# Patient Record
Sex: Female | Born: 1985 | Race: White | Hispanic: No | Marital: Single | Smoking: Never smoker
Health system: Southern US, Community
[De-identification: ages and names within clinical notes are randomized; demographics above are authoritative.]

## PROBLEM LIST (undated history)

## (undated) DIAGNOSIS — E282 Polycystic ovarian syndrome: Secondary | ICD-10-CM

## (undated) DIAGNOSIS — Z8742 Personal history of other diseases of the female genital tract: Secondary | ICD-10-CM

## (undated) DIAGNOSIS — M419 Scoliosis, unspecified: Secondary | ICD-10-CM

## (undated) HISTORY — DX: Personal history of other diseases of the female genital tract: Z87.42

---

## 2017-10-06 ENCOUNTER — Encounter: Payer: Self-pay | Admitting: Emergency Medicine

## 2017-10-06 ENCOUNTER — Emergency Department: Payer: BLUE CROSS/BLUE SHIELD

## 2017-10-06 ENCOUNTER — Emergency Department
Admission: EM | Admit: 2017-10-06 | Discharge: 2017-10-06 | Disposition: A | Discharge status: Home / Self Care | Payer: BLUE CROSS/BLUE SHIELD | Attending: Emergency Medicine | Admitting: Emergency Medicine

## 2017-10-06 DIAGNOSIS — Y999 Unspecified external cause status: Secondary | ICD-10-CM | POA: Insufficient documentation

## 2017-10-06 DIAGNOSIS — Y939 Activity, unspecified: Secondary | ICD-10-CM | POA: Diagnosis not present

## 2017-10-06 DIAGNOSIS — Y929 Unspecified place or not applicable: Secondary | ICD-10-CM | POA: Diagnosis not present

## 2017-10-06 DIAGNOSIS — M25532 Pain in left wrist: Secondary | ICD-10-CM | POA: Diagnosis not present

## 2017-10-06 HISTORY — DX: Scoliosis, unspecified: M41.9

## 2017-10-06 HISTORY — DX: Polycystic ovarian syndrome: E28.2

## 2017-10-06 MED ORDER — MELOXICAM 15 MG PO TABS: 15.0000 mg | ORAL_TABLET | Freq: Every day | ORAL | 1 refills | Status: AC

## 2017-10-06 NOTE — ED Provider Notes (Signed)
[facility] Emergency Department Provider Note  ____________________________________________  Time seen: Approximately 9:18 PM  I have reviewed the triage vital signs and the nursing notes.   HISTORY  Chief Complaint Wrist Pain    HPI Nichole Pitts is a 32 y.o. female presents to the emergency department with 7 out of 10 aching and sore left wrist pain after patient fell on an outstretched left hand while rollerskating earlier tonight.  She denies weakness or changes in sensation of the upper extremities.  No skin compromise.  No alleviating measures have been attempted.   Past Medical History:  Diagnosis Date  . PCOS (polycystic ovarian syndrome)   . Scoliosis     There are no active problems to display for this patient.   History reviewed. No pertinent surgical history.  Prior to Admission medications   Medication Sig Start Date End Date Taking? Authorizing Provider  meloxicam (MOBIC) 15 MG tablet Take 1 tablet (15 mg total) by mouth daily for 7 days. 10/06/17 10/13/17  Orvil Feil, PA-C    Allergies Patient has no known allergies.  No family history on file.  Social History Social History   Tobacco Use  . Smoking status: Never Smoker  . Smokeless tobacco: Never Used  Substance Use Topics  . Alcohol use: Not on file  . Drug use: Not on file     Review of Systems  Constitutional: No fever/chills Eyes: No visual changes. No discharge ENT: No upper respiratory complaints. Cardiovascular: no chest pain. Respiratory: no cough. No SOB. Gastrointestinal: No abdominal pain.  No nausea, no vomiting.  No diarrhea.  No constipation. Musculoskeletal: Patient has left wrist pain.  Skin: Negative for rash, abrasions, lacerations, ecchymosis. Neurological: Negative for headaches, focal weakness or numbness.   ____________________________________________   PHYSICAL EXAM:  VITAL SIGNS: ED Triage Vitals  Enc Vitals Group     BP 10/06/17  1943 116/77     Pulse Rate 10/06/17 1943 96     Resp 10/06/17 1943 18     Temp 10/06/17 1943 98.4 F (36.9 C)     Temp Source 10/06/17 1943 Oral     SpO2 10/06/17 1943 100 %     Weight 10/06/17 1944 165 lb (74.8 kg)     Height 10/06/17 1944 5\' 2"  (1.575 m)     Head Circumference --      Peak Flow --      Pain Score 10/06/17 1944 4     Pain Loc --      Pain Edu? --      Excl. in GC? --      Constitutional: Alert and oriented. Well appearing and in no acute distress. Eyes: Conjunctivae are normal. PERRL. EOMI. Head: Atraumatic. Cardiovascular: Normal rate, regular rhythm. Normal S1 and S2.  Good peripheral circulation. Respiratory: Normal respiratory effort without tachypnea or retractions. Lungs CTAB. Good air entry to the bases with no decreased or absent breath sounds. Musculoskeletal: Patient is able to perform limited range of motion of the left wrist, likely secondary to pain.  She is able to move all 5 left fingers.  Skin overlying left wrist is warm with a palpable radial pulse.  Capillary refill is less than 2 seconds.  No pain was elicited with palpation of the anatomical snuffbox. Neurologic:  Normal speech and language. No gross focal neurologic deficits are appreciated.  Skin:  Skin is warm, dry and intact. No rash noted. Psychiatric: Mood and affect are normal. Speech and behavior are normal. Patient  exhibits appropriate insight and judgement.   ____________________________________________   LABS (all labs ordered are listed, but only abnormal results are displayed)  Labs Reviewed - No data to display ____________________________________________  EKG   ____________________________________________  RADIOLOGY I personally viewed and evaluated these images as part of my medical decision making, as well as reviewing the written report by the radiologist  Dg Wrist Complete Left  Result Date: 10/06/2017 CLINICAL DATA:  Fall during roller-skating today. Left wrist  injury and pain. Initial encounter. EXAM: LEFT WRIST - COMPLETE 3+ VIEW COMPARISON:  None. FINDINGS: There is no evidence of fracture or dislocation. There is no evidence of arthropathy or other focal bone abnormality. Soft tissues are unremarkable. IMPRESSION: Negative. Electronically Signed   By: Myles RosenthalJohn  Stahl M.D.   On: 10/06/2017 20:30    ____________________________________________    PROCEDURES  Procedure(s) performed:    Procedures    Medications - No data to display   ____________________________________________   INITIAL IMPRESSION / ASSESSMENT AND PLAN / ED COURSE  Pertinent labs & imaging results that were available during my care of the patient were reviewed by me and considered in my medical decision making (see chart for details).  Review of the [place] CSRS was performed in accordance of the [facility] prior to dispensing any controlled drugs.    Assessment and plan Left wrist pain Patient presents to the emergency department with left wrist pain after a fall while rollerskating.  Differential diagnosis included fracture versus sprain.  Initial x-ray examination of the left wrist revealed no acute fractures or bony abnormalities.  Patient was splinted in the emergency department and referred to orthopedics.  Meloxicam was prescribed for pain.  All patient questions were answered.     ____________________________________________  FINAL CLINICAL IMPRESSION(S) / ED DIAGNOSES  Final diagnoses:  Left wrist pain      NEW MEDICATIONS STARTED DURING THIS VISIT:  ED Discharge Orders        Ordered    meloxicam (MOBIC) 15 MG tablet  Daily     10/06/17 2040          This chart was dictated using voice recognition software/Dragon. Despite best efforts to proofread, errors can occur which can change the meaning. Any change was purely unintentional.    Gasper LloydWoods, Jaclyn M, PA-C 10/06/17 2121    Sharman CheekStafford, Phillip, MD 10/07/17 [phone]

## 2017-10-06 NOTE — ED Triage Notes (Signed)
Patient states that she was skating and fell. Patient with pain and swelling to left wrist.

## 2018-07-18 ENCOUNTER — Ambulatory Visit: Payer: BLUE CROSS/BLUE SHIELD | Admitting: Certified Nurse Midwife

## 2018-07-18 ENCOUNTER — Other Ambulatory Visit (HOSPITAL_COMMUNITY)
Admission: RE | Admit: 2018-07-18 | Discharge: 2018-07-18 | Disposition: A | Discharge status: Home / Self Care | Payer: BLUE CROSS/BLUE SHIELD | Source: Ambulatory Visit | Attending: Certified Nurse Midwife | Admitting: Certified Nurse Midwife

## 2018-07-18 ENCOUNTER — Encounter: Payer: Self-pay | Admitting: Certified Nurse Midwife

## 2018-07-18 ENCOUNTER — Other Ambulatory Visit: Payer: Self-pay

## 2018-07-18 VITALS — BP 113/78 | HR 99 | Ht 62.0 in | Wt 184.3 lb

## 2018-07-18 DIAGNOSIS — E282 Polycystic ovarian syndrome: Secondary | ICD-10-CM

## 2018-07-18 DIAGNOSIS — Z124 Encounter for screening for malignant neoplasm of cervix: Secondary | ICD-10-CM

## 2018-07-18 DIAGNOSIS — N939 Abnormal uterine and vaginal bleeding, unspecified: Secondary | ICD-10-CM

## 2018-07-18 DIAGNOSIS — Z01419 Encounter for gynecological examination (general) (routine) without abnormal findings: Secondary | ICD-10-CM

## 2018-07-18 DIAGNOSIS — Z6833 Body mass index (BMI) 33.0-33.9, adult: Secondary | ICD-10-CM

## 2018-07-18 NOTE — Patient Instructions (Addendum)
WE WOULD LOVE TO HEAR FROM YOU!!!!   Thank you Nichole Pitts for visiting [facility].  Providing our patients with the best experience possible is really important to [place], and we hope that you felt that on your recent visit. The most valuable feedback we get comes from [place]!!    If you receive a survey please take a couple of minutes to let us know how we did.Thank you for continuing to trust [place] with your care.   [facility]   Polycystic Ovarian Syndrome  Polycystic ovarian syndrome (PCOS) is a common hormonal disorder among women of reproductive age. In most women with PCOS, many small fluid-filled sacs (cysts) grow on the ovaries, and the cysts are not part of a normal menstrual cycle. PCOS can cause problems with your menstrual periods and make it difficult to get pregnant. It can also cause an increased risk of miscarriage with pregnancy. If it is not treated, PCOS can lead to serious health problems, such as diabetes and heart disease. What are the causes? The cause of PCOS is not known, but it may be the result of a combination of certain factors, such as:  Irregular menstrual cycle.  High levels of certain hormones (androgens).  Problems with the hormone that helps to control blood sugar (insulin resistance).  Certain genes. What increases the risk? This condition is more likely to develop in women who have a family history of PCOS. What are the signs or symptoms? Symptoms of PCOS may include:  Multiple ovarian cysts.  Infrequent periods or no periods.  Periods that are too frequent or too heavy.  Unpredictable periods.  Inability to get pregnant (infertility) because of not ovulating.  Increased growth of hair on the face, chest, stomach, back, thumbs, thighs, or toes.  Acne or oily skin. Acne may develop during adulthood, and it may not respond to treatment.  Pelvic pain.  Weight gain or obesity.  Patches of thickened and  dark brown or black skin on the neck, arms, breasts, or thighs (acanthosis nigricans).  Excess hair growth on the face, chest, abdomen, or upper thighs (hirsutism). How is this diagnosed? This condition is diagnosed based on:  Your medical history.  A physical exam, including a pelvic exam. Your health care provider may look for areas of increased hair growth on your skin.  Tests, such as: ? Ultrasound. This may be used to examine the ovaries and the lining of the uterus (endometrium) for cysts. ? Blood tests. These may be used to check levels of sugar (glucose), female hormone (testosterone), and female hormones (estrogen and progesterone) in your blood. How is this treated? There is no cure for PCOS, but treatment can help to manage symptoms and prevent more health problems from developing. Treatment varies depending on:  Your symptoms.  Whether you want to have a baby or whether you need birth control (contraception). Treatment may include nutrition and lifestyle changes along with:  Progesterone hormone to start a menstrual period.  Birth control pills to help you have regular menstrual periods.  Medicines to make you ovulate, if you want to get pregnant.  Medicine to reduce excessive hair growth.  Surgery, in severe cases. This may involve making small holes in one or both of your ovaries. This decreases the amount of testosterone that your body produces. Follow these instructions at home:  Take over-the-counter and prescription medicines only as told by your health care provider.  Follow a healthy meal plan. This can help you reduce  the effects of PCOS. ? Eat a healthy diet that includes lean proteins, complex carbohydrates, fresh fruits and vegetables, low-fat dairy products, and healthy fats. Make sure to eat enough fiber.  If you are overweight, lose weight as told by your health care provider. ? Losing 10% of your body weight may improve symptoms. ? Your health care  provider can determine how much weight loss is best for you and can help you lose weight safely.  Keep all follow-up visits as told by your health care provider. This is important. Contact a health care provider if:  Your symptoms do not get better with medicine.  You develop new symptoms. This information is not intended to replace advice given to you by your health care provider. Make sure you discuss any questions you have with your health care provider. Document Released: 08/11/2004 Document Revised: 12/14/2015 Document Reviewed: 10/03/2015 Elsevier Interactive Patient Education  2019 [place].   Diet for Polycystic Ovary Syndrome Polycystic ovary syndrome (PCOS) is a disorder of the chemicals (hormones) that regulate a woman's reproductive system, including monthly periods (menstruation). The condition causes important hormones to be out of balance. PCOS can:  Stop your periods or make them irregular.  Cause cysts to develop on your ovaries.  Make it difficult to get pregnant.  Stop your body from responding to the effects of insulin (insulin resistance). Insulin resistance can lead to obesity and diabetes. Changing what you eat can help you manage PCOS and improve your health. Following a balanced diet can help you lose weight and improve the way that your body uses insulin. What are tips for following this plan?  Follow a balanced diet for meals and snacks. Eat breakfast, lunch, dinner, and one or two snacks every day.  Include protein in each meal and snack.  Choose whole grains instead of products that are made with refined flour.  Eat a variety of foods.  Exercise regularly as told by your health care provider. Aim to do 30 or more minutes of exercise on most days of the week.  If you are overweight or obese: ? Pay attention to how many calories you eat. Cutting down on calories can help you lose weight. ? Work with your health care provider or a diet and  nutrition specialist (dietitian) to figure out how many calories you need each day. What foods can I eat?  Fruits Include a variety of colors and types. All fruits are helpful for PCOS. Vegetables Include a variety of colors and types. All vegetables are helpful for PCOS. Grains Whole grains, such as whole wheat. Whole-grain breads, crackers, cereals, and pasta. Unsweetened oatmeal, bulgur, barley, quinoa, and brown rice. Tortillas made from corn or whole-wheat flour. Meats and other proteins Low-fat (lean) proteins, such as fish, chicken, beans, eggs, and tofu. Dairy Low-fat dairy products, such as skim milk, cheese sticks, and yogurt. Beverages Low-fat or fat-free drinks, such as water, low-fat milk, sugar-free drinks, and small amounts of 100% fruit juice. Seasonings and condiments Ketchup. Mustard. Barbecue sauce. Relish. Low-fat or fat-free mayonnaise. Fats and oils Olive oil or canola oil. Walnuts and almonds. The items listed above may not be a complete list of recommended foods and beverages. Contact a dietitian for more options. What foods are not recommended? Foods that are high in calories or fat. Fried foods. Sweets. Products that are made from refined white flour, including white bread, pastries, white rice, and pasta. The items listed above may not be a complete list of foods and  beverages to avoid. Contact a dietitian for more information. Summary  PCOS is a hormonal imbalance that affects a woman's reproductive system.  You can help to manage your PCOS by exercising regularly and eating a healthy, varied diet of vegetables, fruit, whole grains, low-fat (lean) protein, and low-fat dairy products.  Changing what you eat can improve the way that your body uses insulin, help your hormones reach normal levels, and help you lose weight. This information is not intended to replace advice given to you by your health care provider. Make sure you discuss any questions you have  with your health care provider. Document Released: 08/09/2015 Document Revised: 02/19/2017 Document Reviewed: 02/19/2017 Elsevier Interactive Patient Education  2019 [place] 18-39 Years, Female Preventive care refers to lifestyle choices and visits with your health care provider that can promote health and wellness. What does preventive care include?   A yearly physical exam. This is also called an annual well check.  Dental exams once or twice a year.  Routine eye exams. Ask your health care provider how often you should have your eyes checked.  Personal lifestyle choices, including: ? Daily care of your teeth and gums. ? Regular physical activity. ? Eating a healthy diet. ? Avoiding tobacco and drug use. ? Limiting alcohol use. ? Practicing safe sex. ? Taking vitamin and mineral supplements as recommended by your health care provider. What happens during an annual well check? The services and screenings done by your health care provider during your annual well check will depend on your age, overall health, lifestyle risk factors, and family history of disease. Counseling Your health care provider may ask you questions about your:  Alcohol use.  Tobacco use.  Drug use.  Emotional well-being.  Home and relationship well-being.  Sexual activity.  Eating habits.  Work and work Statistician.  Method of birth control.  Menstrual cycle.  Pregnancy history. Screening You may have the following tests or measurements:  Height, weight, and BMI.  Diabetes screening. This is done by checking your blood sugar (glucose) after you have not eaten for a while (fasting).  Blood pressure.  Lipid and cholesterol levels. These may be checked every 5 years starting at age 63.  Skin check.  Hepatitis C blood test.  Hepatitis B blood test.  Sexually transmitted disease (STD) testing.  BRCA-related cancer screening. This may be done if you have a  family history of breast, ovarian, tubal, or peritoneal cancers.  Pelvic exam and Pap test. This may be done every 3 years starting at age 43. Starting at age 74, this may be done every 5 years if you have a Pap test in combination with an HPV test. Discuss your test results, treatment options, and if necessary, the need for more tests with your health care provider. Vaccines Your health care provider may recommend certain vaccines, such as:  Influenza vaccine. This is recommended every year.  Tetanus, diphtheria, and acellular pertussis (Tdap, Td) vaccine. You may need a Td booster every 10 years.  Varicella vaccine. You may need this if you have not been vaccinated.  HPV vaccine. If you are 52 or younger, you may need three doses over 6 months.  Measles, mumps, and rubella (MMR) vaccine. You may need at least one dose of MMR. You may also need a second dose.  Pneumococcal 13-valent conjugate (PCV13) vaccine. You may need this if you have certain conditions and were not previously vaccinated.  Pneumococcal polysaccharide (PPSV23) vaccine. You may  need one or two doses if you smoke cigarettes or if you have certain conditions.  Meningococcal vaccine. One dose is recommended if you are age 69-21 years and a first-year college student living in a residence hall, or if you have one of several medical conditions. You may also need additional booster doses.  Hepatitis A vaccine. You may need this if you have certain conditions or if you travel or work in places where you may be exposed to hepatitis A.  Hepatitis B vaccine. You may need this if you have certain conditions or if you travel or work in places where you may be exposed to hepatitis B.  Haemophilus influenzae type b (Hib) vaccine. You may need this if you have certain risk factors. Talk to your health care provider about which screenings and vaccines you need and how often you need them. This information is not intended to replace  advice given to you by your health care provider. Make sure you discuss any questions you have with your health care provider. Document Released: 06/13/2001 Document Revised: 11/28/2016 Document Reviewed: 02/16/2015 Elsevier Interactive Patient Education  2019 [employer].

## 2018-07-18 NOTE — Progress Notes (Addendum)
ANNUAL PREVENTATIVE CARE GYN  ENCOUNTER NOTE  Subjective:       Nichole Pitts is a 33 y.o. G0P0000 female here for a routine annual gynecologic exam.  Current complaints: 1. Irregular menses  History of PCOS. Not currently taking medications, symptoms are management with dietary and lifestyle modifications. Labs last collected in 2013 or 2014. Never had ultrasound.   Reports intermittent irregular bleeding for the last three (3) to four (4), varying from heavy vaginal bleeding to light spotting to light dark vaginal discharge.   Denies difficulty breathing or respiratory distress, chest pain, abdominal pain, excessive vaginal bleeding, dysuria, and leg pain or swelling.    Gynecologic History  Patient's last menstrual period was 06/23/2018 (exact date). Period Duration (Days): 3 Period Pattern: (!) Irregular Menstrual Flow: Moderate Menstrual Control: Panty liner, Thin pad, Maxi pad, Tampon Dysmenorrhea: (!) Moderate Dysmenorrhea Symptoms: Cramping  Contraception: abstinence  Last Pap: 2017. Results were: normal per patient  Obstetric History  OB History  Gravida Para Term Preterm AB Living  0 0 0 0 0 0  SAB TAB Ectopic Multiple Live Births  0 0 0 0 0    Past Medical History:  Diagnosis Date  . History of PCOS   . PCOS (polycystic ovarian syndrome)   . Scoliosis     No past surgical history on file.  No Known Allergies  Social History   Socioeconomic History  . Marital status: Married    Spouse name: Not on file  . Number of children: Not on file  . Years of education: Not on file  . Highest education level: Not on file  Occupational History  . Not on file  Social Needs  . Financial resource strain: Not on file  . Food insecurity:    Worry: Not on file    Inability: Not on file  . Transportation needs:    Medical: Not on file    Non-medical: Not on file  Tobacco Use  . Smoking status: Never Smoker  . Smokeless tobacco: Never Used  Substance and  Sexual Activity  . Alcohol use: Yes  . Drug use: Never  . Sexual activity: Not Currently  Lifestyle  . Physical activity:    Days per week: Not on file    Minutes per session: Not on file  . Stress: Not on file  Relationships  . Social connections:    Talks on phone: Not on file    Gets together: Not on file    Attends religious service: Not on file    Active member of club or organization: Not on file    Attends meetings of clubs or organizations: Not on file    Relationship status: Not on file  . Intimate partner violence:    Fear of current or ex partner: Not on file    Emotionally abused: Not on file    Physically abused: Not on file    Forced sexual activity: Not on file  Other Topics Concern  . Not on file  Social History Narrative  . Not on file    Family History  Problem Relation Age of Onset  . Cancer Father   . Diabetes Maternal Grandmother   . Stroke Maternal Grandmother   . Diabetes Paternal Grandmother     The following portions of the patient's history were reviewed and updated as appropriate: allergies, current medications, past family history, past medical history, past social history, past surgical history and problem list.  Review of Systems  ROS negative except  as noted above. Information obtained from patient.    Objective:   BP 113/78   Pulse 99   Ht 5\' 2"  (1.575 m)   Wt 184 lb 5 oz (83.6 kg)   LMP 06/23/2018 (Exact Date)   BMI 33.71 kg/m    CONSTITUTIONAL: Well-developed, well-nourished female in no acute distress.   PSYCHIATRIC: Normal mood and affect. Normal behavior. Normal judgment and thought content.  NEUROLGIC: Alert and oriented to person, place, and time. Normal muscle tone coordination. No cranial nerve deficit noted.  HENT:  Normocephalic, atraumatic, External right and left ear normal.   EYES: Conjunctivae and EOM are normal. Pupils are equal and round.   NECK: Normal range of motion, supple, no masses.  Normal thyroid.    SKIN: Skin is warm and dry. No rash noted. Not diaphoretic. No erythema. No pallor.  CARDIOVASCULAR: Normal heart rate noted, regular rhythm, no murmur.  RESPIRATORY: Clear to auscultation bilaterally. Effort and breath sounds normal, no problems with respiration noted.  BREASTS: Symmetric in size. No masses, skin changes, nipple drainage, or lymphadenopathy.  ABDOMEN: Soft, normal bowel sounds, no distention noted.  No tenderness, rebound or guarding.   PELVIC:  External Genitalia: Normal  BUS: Normal  Vagina: Normal  Cervix: Normal  Uterus: Normal  Adnexa: Normal  MUSCULOSKELETAL: Normal range of motion. No tenderness.  No cyanosis, clubbing, or edema.  2+ distal pulses.  LYMPHATIC: No Axillary, Supraclavicular, or Inguinal Adenopathy.  Assessment:   Annual gynecologic examination 33 y.o.   Contraception: abstinence   Obesity 1   Problem List Items Addressed This Visit      Other   BMI 33.0-33.9,adult   Relevant Orders   Thyroid Panel With TSH   Hemoglobin A1c   Lipid panel   Comprehensive metabolic panel    Other Visit Diagnoses    Well woman exam    -  Primary   Relevant Orders   CBC   Thyroid Panel With TSH   FSH/LH   Hemoglobin A1c   Lipid panel   Testosterone, Free, Total, SHBG   Comprehensive metabolic panel   DHEA-sulfate   Prolactin   Cytology - PAP   PCOS (polycystic ovarian syndrome)       Relevant Orders   Thyroid Panel With TSH   FSH/LH   Hemoglobin A1c   Lipid panel   Testosterone, Free, Total, SHBG   DHEA-sulfate   Prolactin   Abnormal uterine bleeding       Relevant Orders   CBC   Thyroid Panel With TSH   FSH/LH   Comprehensive metabolic panel   DHEA-sulfate   Prolactin   Screening for cervical cancer          Plan:   Pap: Pap Co Test  Labs: See orders, will contact patient with results.   Routine preventative health maintenance measures emphasized: Exercise/Diet/Weight control, Tobacco Warnings, Alcohol/Substance  use risks and Stress Management; see AVS  Discussed medication options for PCOS management, handouts given  Reviewed red flag symptoms and when to call  RTC x 1 year for ANNUAL EXAM or sooner ifneeded.    Gunnar Bulla, CNM [facility], [facility] 07/18/18 1:18 PM

## 2018-07-19 LAB — COMPREHENSIVE METABOLIC PANEL
ALBUMIN: 4.3 g/dL (ref 3.9–5.0)
ALT: 15 IU/L (ref 0–32)
AST: 17 IU/L (ref 0–40)
Albumin/Globulin Ratio: 1.9 (ref 1.2–2.2)
Alkaline Phosphatase: 74 IU/L (ref 39–117)
BILIRUBIN TOTAL: 0.3 mg/dL (ref 0.0–1.2)
BUN / CREAT RATIO: 14 (ref 9–23)
BUN: 9 mg/dL (ref 6–20)
CALCIUM: 9.3 mg/dL (ref 8.7–10.2)
CO2: 22 mmol/L (ref 20–29)
Chloride: 105 mmol/L (ref 96–106)
Creatinine, Ser: 0.66 mg/dL (ref 0.57–1.00)
GFR, EST AFRICAN AMERICAN: 137 mL/min/{1.73_m2} (ref >59)
GFR, EST NON AFRICAN AMERICAN: 119 mL/min/{1.73_m2} (ref >59)
GLUCOSE: 93 mg/dL (ref 65–99)
Globulin, Total: 2.3 g/dL (ref 1.5–4.5)
Potassium: 4.4 mmol/L (ref 3.5–5.2)
Sodium: 139 mmol/L (ref 134–144)
TOTAL PROTEIN: 6.6 g/dL (ref 6.0–8.5)

## 2018-07-19 LAB — CBC
HEMATOCRIT: 36.1 % (ref 34.0–46.6)
Hemoglobin: 12.3 g/dL (ref 11.1–15.9)
MCH: 33.1 pg — ABNORMAL HIGH (ref 26.6–33.0)
MCHC: 34.1 g/dL (ref 31.5–35.7)
MCV: 97 fL (ref 79–97)
PLATELETS: 243 10*3/uL (ref 150–450)
RBC: 3.72 x10E6/uL — ABNORMAL LOW (ref 3.77–5.28)
RDW: 11.2 % — AB (ref 11.7–15.4)
WBC: 6.4 10*3/uL (ref 3.4–10.8)

## 2018-07-19 LAB — PROLACTIN: Prolactin: 14.4 ng/mL (ref 4.8–23.3)

## 2018-07-19 LAB — LIPID PANEL
CHOL/HDL RATIO: 3 ratio (ref 0.0–4.4)
CHOLESTEROL TOTAL: 160 mg/dL (ref 100–199)
HDL: 54 mg/dL (ref >39)
LDL CALC: 93 mg/dL (ref 0–99)
TRIGLYCERIDES: 64 mg/dL (ref 0–149)
VLDL Cholesterol Cal: 13 mg/dL (ref 5–40)

## 2018-07-19 LAB — THYROID PANEL WITH TSH
Free Thyroxine Index: 1.8 (ref 1.2–4.9)
T3 Uptake Ratio: 23 % — ABNORMAL LOW (ref 24–39)
T4, Total: 8 ug/dL (ref 4.5–12.0)
TSH: 1.86 u[IU]/mL (ref 0.450–4.500)

## 2018-07-19 LAB — TESTOSTERONE, FREE, TOTAL, SHBG
SEX HORMONE BINDING: 70.6 nmol/L (ref 24.6–122.0)
Testosterone, Free: 2 pg/mL (ref 0.0–4.2)
Testosterone: 37 ng/dL (ref 8–48)

## 2018-07-19 LAB — FSH/LH
FSH: 2.6 m[IU]/mL
LH: 5.9 m[IU]/mL

## 2018-07-19 LAB — DHEA-SULFATE: DHEA-SO4: 170.2 ug/dL (ref 84.8–378.0)

## 2018-07-19 LAB — HEMOGLOBIN A1C
Est. average glucose Bld gHb Est-mCnc: 91 mg/dL
Hgb A1c MFr Bld: 4.8 % (ref 4.8–5.6)

## 2018-07-20 ENCOUNTER — Encounter: Payer: Self-pay | Admitting: Certified Nurse Midwife

## 2018-07-22 LAB — CYTOLOGY - PAP
DIAGNOSIS: NEGATIVE
HPV (WINDOPATH): NOT DETECTED

## 2018-07-23 ENCOUNTER — Encounter: Payer: Self-pay | Admitting: Certified Nurse Midwife

## 2018-07-28 ENCOUNTER — Encounter: Payer: Self-pay | Admitting: Certified Nurse Midwife

## 2018-09-13 ENCOUNTER — Encounter: Payer: Self-pay | Admitting: Certified Nurse Midwife

## 2018-10-01 ENCOUNTER — Encounter: Payer: Self-pay | Admitting: Certified Nurse Midwife

## 2020-01-01 IMAGING — DX DG WRIST COMPLETE 3+V*L*
4 series · 4 of 4 positions shown · non-contrast
Comparison: None.

CLINICAL DATA: Fall during roller-skating today. Left wrist injury
and pain. Initial encounter.

EXAM:
LEFT WRIST - COMPLETE 3+ VIEW

[wrist ap (1 of 2)]
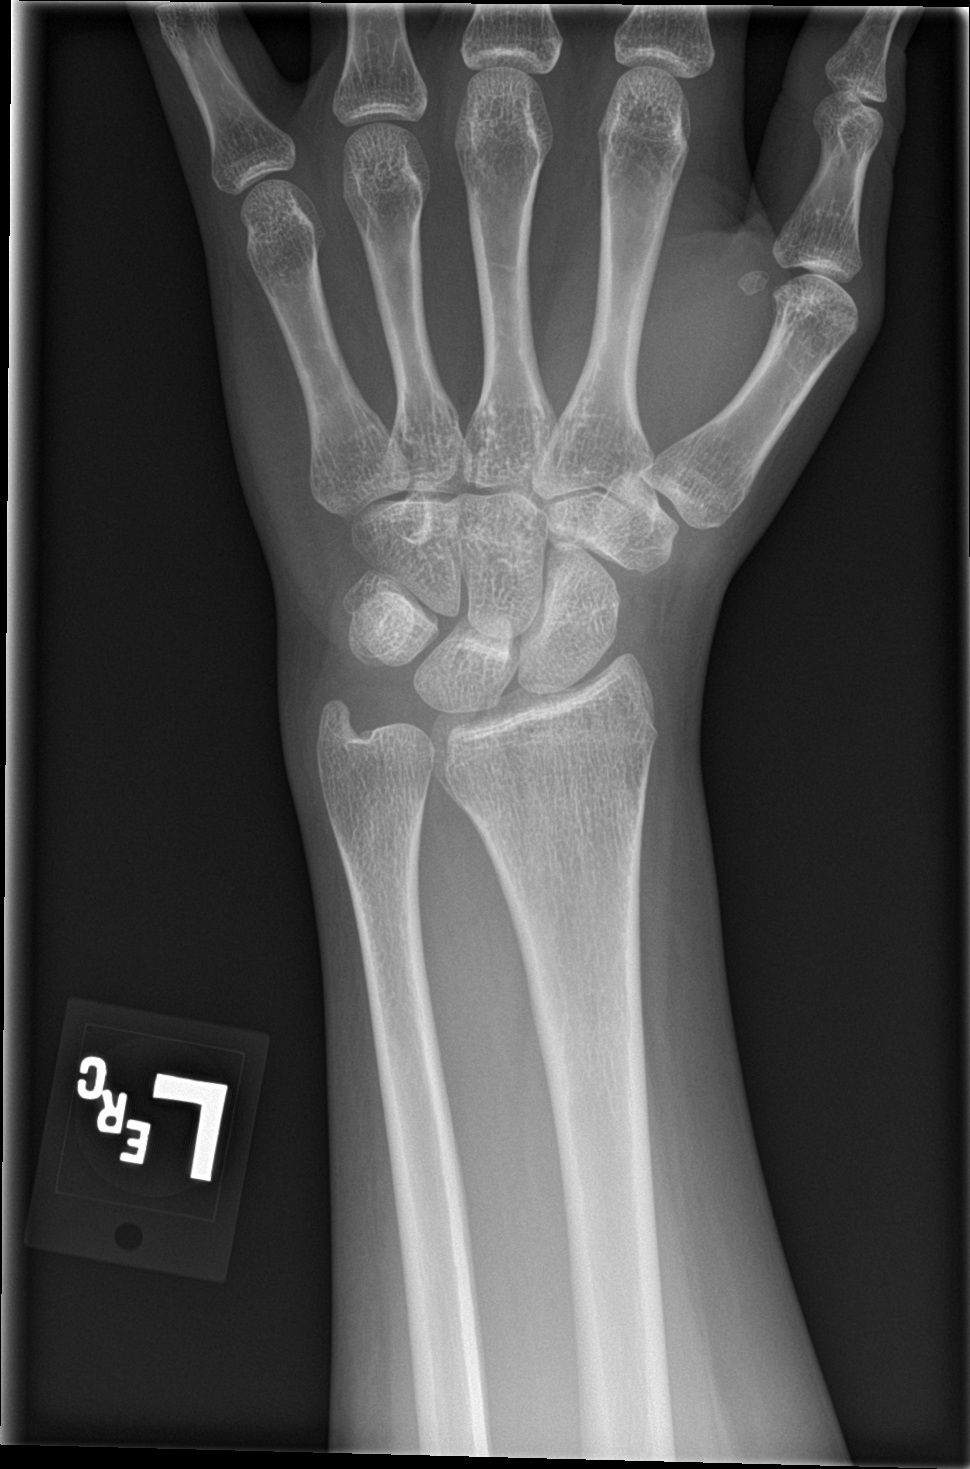

[wrist obl]
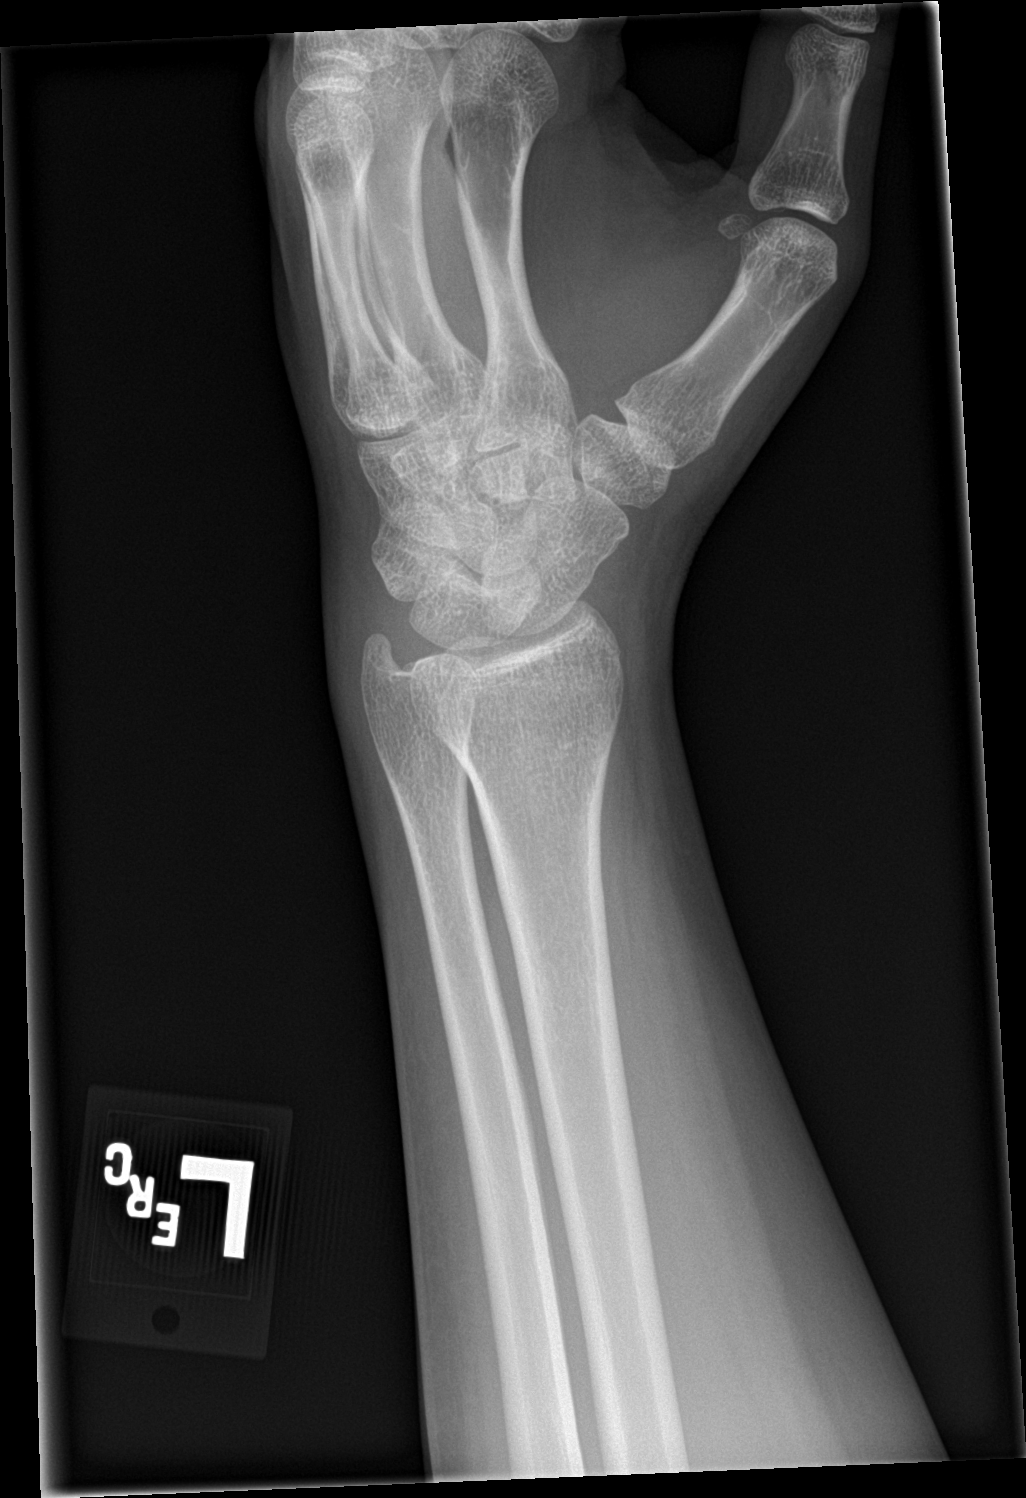

[wrist lat]
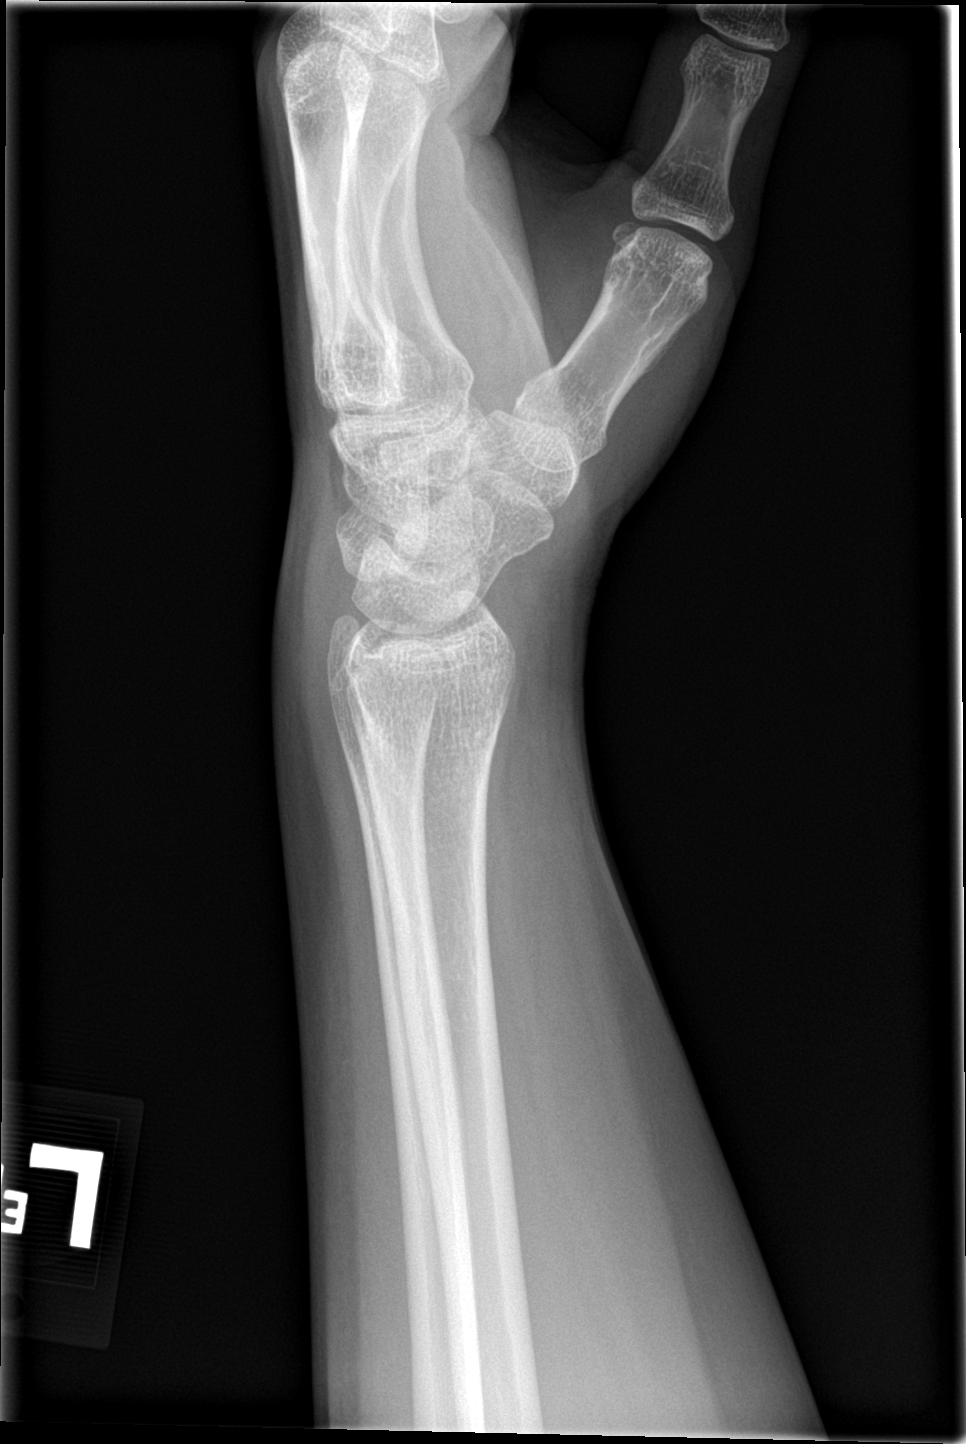

[wrist ap (2 of 2)]
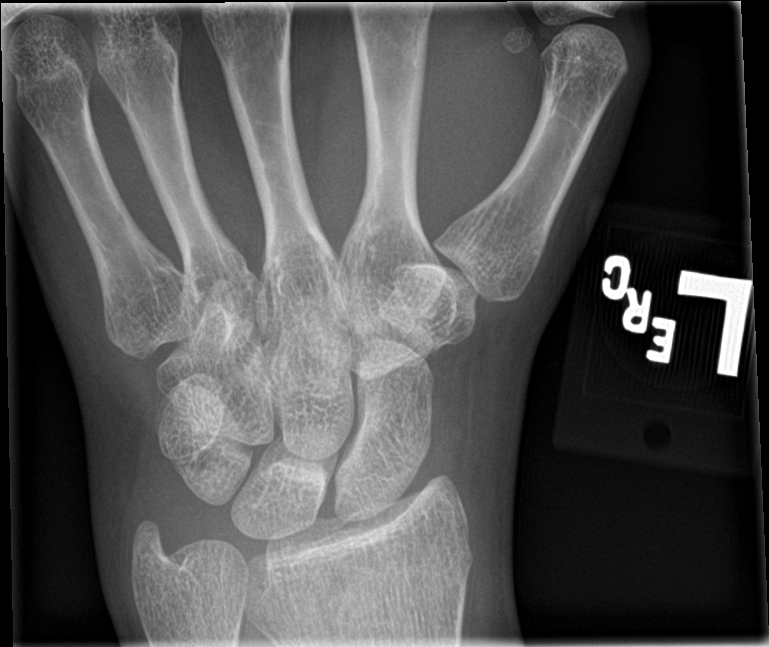

[4 of 4 positions shown; findings below may reference images not displayed]

FINDINGS: There is no evidence of fracture or dislocation. There is no
evidence of arthropathy or other focal bone abnormality. Soft
tissues are unremarkable.
IMPRESSION: Negative.

## 2020-04-08 ENCOUNTER — Ambulatory Visit (INDEPENDENT_AMBULATORY_CARE_PROVIDER_SITE_OTHER): Payer: BLUE CROSS/BLUE SHIELD | Admitting: Hospice and Palliative Medicine

## 2020-04-08 ENCOUNTER — Encounter: Payer: Self-pay | Admitting: Hospice and Palliative Medicine

## 2020-04-08 ENCOUNTER — Other Ambulatory Visit: Payer: Self-pay

## 2020-04-08 VITALS — BP 122/76 | HR 96 | Temp 97.9°F | Resp 16 | Ht 62.0 in | Wt 175.0 lb

## 2020-04-08 DIAGNOSIS — E282 Polycystic ovarian syndrome: Secondary | ICD-10-CM

## 2020-04-08 DIAGNOSIS — Z7689 Persons encountering health services in other specified circumstances: Secondary | ICD-10-CM | POA: Diagnosis not present

## 2020-04-08 NOTE — Progress Notes (Signed)
[facility] [address] [place], [place] [address]  Internal MEDICINE  Office Visit Note  Patient Name: Nichole Pitts  [id number]  [id number]  Date of Service: 04/12/2020   Complaints/HPI Nichole Pitts is here for establishment of PCP. Chief Complaint  Patient presents with  . New Patient (Initial Visit)  . Gastroesophageal Reflux  . 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   No facility-administered encounter medications on file as of 04/08/2020.    Surgical History: History reviewed. No pertinent surgical history.  Medical History: Past Medical History:  Diagnosis Date  . History of PCOS   . PCOS (polycystic ovarian syndrome)   . Scoliosis     Family History: Family History  Problem Relation Age of Onset  . Cancer Father   . Diabetes Maternal Grandmother   . Stroke Maternal Grandmother   . Diabetes Paternal Grandmother   . Hypertension Paternal Grandmother   . Hypertension Mother   . Hypertension Brother     Social History   Socioeconomic History  . Marital status: Single    Spouse name: Not on file  . Number of children: Not on file  . Years of education: Not on file  . Highest education level: Not on file  Occupational History  . Not on file  Tobacco  Use  . Smoking status: Never Smoker  . Smokeless tobacco: Never Used  Substance and Sexual Activity  . Alcohol use: Yes    Comment: occ  . Drug use: Never  . Sexual activity: Not Currently  Other Topics Concern  . Not on file  Social History Narrative  . Not on file   Social Determinants of Health   Financial Resource Strain: Not on file  Food Insecurity: Not on file  Transportation Needs: Not on file  Physical Activity: Not on file  Stress: Not on file  Social Connections: Not on file  Intimate Partner Violence: Not on file     Review of Systems  Constitutional: Negative for chills, diaphoresis and fatigue.  HENT: Negative for ear pain, postnasal drip and sinus pressure.   Eyes: Negative for photophobia, discharge, redness, itching and visual disturbance.  Respiratory: Negative for cough, shortness of breath and wheezing.   Cardiovascular: Negative for chest pain, palpitations and leg swelling.  Gastrointestinal: Negative for abdominal pain, constipation, diarrhea, nausea and vomiting.  Genitourinary: Negative for dysuria and flank pain.  Musculoskeletal: Negative for arthralgias, back pain, gait problem and neck pain.  Skin: Negative for color change.  Allergic/Immunologic: Negative for environmental allergies and food allergies.  Neurological: Negative for dizziness and headaches.  Hematological: Does not bruise/bleed easily.  Psychiatric/Behavioral: Negative for agitation, behavioral problems (depression) and hallucinations.    Vital Signs: BP 122/76   Pulse 96   Temp 97.9 F (36.6 C)   Resp 16   Ht 5\' 2"  (1.575 m)  Wt 175 lb (79.4 kg)   SpO2 98%   BMI 32.01 kg/m    Physical Exam Vitals reviewed.  Constitutional:      Appearance: Normal appearance. Nichole Pitts is normal weight.  Cardiovascular:     Rate and Rhythm: Normal rate and regular rhythm.     Pulses: Normal pulses.     Heart sounds: Normal heart sounds.  Pulmonary:     Effort: Pulmonary effort is  normal.     Breath sounds: Normal breath sounds.  Abdominal:     General: Abdomen is flat.  Musculoskeletal:        General: Normal range of motion.     Cervical back: Normal range of motion.  Skin:    General: Skin is warm.  Neurological:     General: No focal deficit present.     Mental Status: Nichole Pitts is alert and oriented to person, place, and time. Mental status is at baseline.  Psychiatric:        Mood and Affect: Mood normal.        Behavior: Behavior normal.        Thought Content: Thought content normal.        Judgment: Judgment normal.    Assessment/Plan: 1. Encounter to establish care with new doctor Will review baseline labs and adjust plan of care as indicated - CBC w/Diff/Platelet - Comprehensive Metabolic Panel (CMET) - Lipid Panel With LDL/HDL Ratio - TSH + free T4  2. PCOS (polycystic ovarian syndrome) Symptoms remain well controlled without medication therapy at this time, continue monitoring  General Counseling: shimeka bacot understanding of the findings of todays visit and agrees with plan of treatment. I have discussed any further diagnostic evaluation that may be needed or ordered today. We also reviewed Nichole Pitts medications today. Nichole Pitts has been encouraged to call the office with any questions or concerns that should arise related to todays visit.  Orders Placed This Encounter  Procedures  . CBC w/Diff/Platelet  . Comprehensive Metabolic Panel (CMET)  . Lipid Panel With LDL/HDL Ratio  . TSH + free T4      Time spent:30  Minutes Time spent includes review of chart, medications, test results and follow-up plan with the patient.  This patient was seen by Leeanne Deed AGNP-C in Collaboration with Dr Lyndon Code as a part of collaborative care agreement  Leeanne Deed [facility] Internal Medicine

## 2020-04-10 LAB — COMPREHENSIVE METABOLIC PANEL
ALT: 19 IU/L (ref 0–32)
AST: 22 IU/L (ref 0–40)
Albumin/Globulin Ratio: 1.6 (ref 1.2–2.2)
Albumin: 4.1 g/dL (ref 3.8–4.8)
Alkaline Phosphatase: 82 IU/L (ref 44–121)
BUN/Creatinine Ratio: 16 (ref 9–23)
BUN: 10 mg/dL (ref 6–20)
Bilirubin Total: 0.3 mg/dL (ref 0.0–1.2)
CO2: 21 mmol/L (ref 20–29)
Calcium: 9.3 mg/dL (ref 8.7–10.2)
Chloride: 105 mmol/L (ref 96–106)
Creatinine, Ser: 0.63 mg/dL (ref 0.57–1.00)
GFR calc Af Amer: 137 mL/min/{1.73_m2} (ref >59)
GFR calc non Af Amer: 119 mL/min/{1.73_m2} (ref >59)
Globulin, Total: 2.5 g/dL (ref 1.5–4.5)
Glucose: 79 mg/dL (ref 65–99)
Potassium: 4.6 mmol/L (ref 3.5–5.2)
Sodium: 139 mmol/L (ref 134–144)
Total Protein: 6.6 g/dL (ref 6.0–8.5)

## 2020-04-10 LAB — LIPID PANEL WITH LDL/HDL RATIO
Cholesterol, Total: 167 mg/dL (ref 100–199)
HDL: 52 mg/dL (ref >39)
LDL Chol Calc (NIH): 103 mg/dL — ABNORMAL HIGH (ref 0–99)
LDL/HDL Ratio: 2 ratio (ref 0.0–3.2)
Triglycerides: 63 mg/dL (ref 0–149)
VLDL Cholesterol Cal: 12 mg/dL (ref 5–40)

## 2020-04-10 LAB — CBC WITH DIFFERENTIAL/PLATELET
Basophils Absolute: 0.1 10*3/uL (ref 0.0–0.2)
Basos: 1 %
EOS (ABSOLUTE): 0.1 10*3/uL (ref 0.0–0.4)
Eos: 2 %
Hematocrit: 38.6 % (ref 34.0–46.6)
Hemoglobin: 12.8 g/dL (ref 11.1–15.9)
Immature Grans (Abs): 0 10*3/uL (ref 0.0–0.1)
Immature Granulocytes: 0 %
Lymphocytes Absolute: 1.3 10*3/uL (ref 0.7–3.1)
Lymphs: 26 %
MCH: 32.7 pg (ref 26.6–33.0)
MCHC: 33.2 g/dL (ref 31.5–35.7)
MCV: 99 fL — ABNORMAL HIGH (ref 79–97)
Monocytes Absolute: 0.4 10*3/uL (ref 0.1–0.9)
Monocytes: 9 %
Neutrophils Absolute: 3.2 10*3/uL (ref 1.4–7.0)
Neutrophils: 62 %
Platelets: 254 10*3/uL (ref 150–450)
RBC: 3.91 x10E6/uL (ref 3.77–5.28)
RDW: 11 % — ABNORMAL LOW (ref 11.7–15.4)
WBC: 5.1 10*3/uL (ref 3.4–10.8)

## 2020-04-10 LAB — TSH+FREE T4
Free T4: 1.13 ng/dL (ref 0.82–1.77)
TSH: 1.61 u[IU]/mL (ref 0.450–4.500)

## 2020-04-12 ENCOUNTER — Encounter: Payer: Self-pay | Admitting: Hospice and Palliative Medicine

## 2020-04-22 ENCOUNTER — Encounter: Payer: Self-pay | Admitting: Hospice and Palliative Medicine

## 2020-04-22 ENCOUNTER — Other Ambulatory Visit: Payer: Self-pay

## 2020-04-22 ENCOUNTER — Ambulatory Visit (INDEPENDENT_AMBULATORY_CARE_PROVIDER_SITE_OTHER): Payer: BLUE CROSS/BLUE SHIELD | Admitting: Hospice and Palliative Medicine

## 2020-04-22 VITALS — BP 102/70 | HR 75 | Temp 97.7°F | Resp 16 | Ht 62.0 in | Wt 174.0 lb

## 2020-04-22 DIAGNOSIS — F411 Generalized anxiety disorder: Secondary | ICD-10-CM

## 2020-04-22 DIAGNOSIS — G4700 Insomnia, unspecified: Secondary | ICD-10-CM

## 2020-04-22 NOTE — Progress Notes (Signed)
[facility] [address] [place], [place] [address]  Internal MEDICINE  Office Visit Note  Patient Name: Nichole Pitts  [id number]  [id number]  Date of Service: 04/30/2020  Chief Complaint  Patient presents with  . 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   No facility-administered encounter medications on file as of 04/22/2020.    Surgical History: History reviewed. No pertinent surgical history.  Medical History: Past Medical History:  Diagnosis Date  . History of PCOS   . PCOS (polycystic ovarian syndrome)   . Scoliosis     Family History: Family History  Problem Relation Age of Onset  . Cancer Father   . Diabetes Maternal Grandmother   . Stroke Maternal Grandmother   . Diabetes Paternal Grandmother   . Hypertension Paternal Grandmother   . Hypertension Mother   . Hypertension Brother     Social History   Socioeconomic History  . Marital status: Single    Spouse name: Not on file  . Number of children: Not on file  . Years of education: Not on file  . Highest education level: Not on file  Occupational History  . Not on file  Tobacco Use  . Smoking status: Never Smoker  . Smokeless tobacco: Never Used  Substance and Sexual Activity  . Alcohol use: Yes    Comment: occ  . Drug use: Never  . Sexual activity: Not Currently  Other Topics Concern  . Not on file  Social History  Narrative  . Not on file   Social Determinants of Health   Financial Resource Strain: Not on file  Food Insecurity: Not on file  Transportation Needs: Not on file  Physical Activity: Not on file  Stress: Not on file  Social Connections: Not on file  Intimate Partner Violence: Not on file      Review of Systems  Constitutional: Negative for chills, diaphoresis and fatigue.  HENT: Negative for ear pain, postnasal drip and sinus pressure.   Eyes: Negative for photophobia, discharge, redness, itching and visual disturbance.  Respiratory: Negative for cough, shortness of breath and wheezing.   Cardiovascular: Negative for chest pain, palpitations and leg swelling.  Gastrointestinal: Negative for abdominal pain, constipation, diarrhea, nausea and vomiting.  Genitourinary: Negative for dysuria and flank pain.  Musculoskeletal: Negative for arthralgias, back pain, gait problem and neck pain.  Skin: Negative for color change.  Allergic/Immunologic: Negative for environmental allergies and food allergies.  Neurological: Negative for dizziness and headaches.  Hematological: Does not bruise/bleed easily.  Psychiatric/Behavioral: Positive for sleep disturbance. Negative for agitation, behavioral problems (depression) and hallucinations. The patient is nervous/anxious.     Vital Signs: BP 102/70   Pulse 75   Temp 97.7 F (36.5 C)   Resp 16   Ht 5\' 2"  (1.575 m)   Wt 174 lb (78.9 kg)   SpO2 98%   BMI 31.83 kg/m    Physical Exam Vitals reviewed.  Constitutional:  Appearance: Normal appearance. She is obese.  Cardiovascular:     Rate and Rhythm: Normal rate and regular rhythm.     Pulses: Normal pulses.     Heart sounds: Normal heart sounds.  Pulmonary:     Effort: Pulmonary effort is normal.     Breath sounds: Normal breath sounds.  Abdominal:     General: Abdomen is flat.     Palpations: Abdomen is soft.  Musculoskeletal:        General: Normal range of motion.   Skin:    General: Skin is warm.  Neurological:     General: No focal deficit present.     Mental Status: She is alert and oriented to person, place, and time. Mental status is at baseline.  Psychiatric:        Mood and Affect: Mood normal.        Behavior: Behavior normal.        Thought Content: Thought content normal.        Judgment: Judgment normal.    Assessment/Plan: 1. Insomnia, unspecified type Discussed taking Melatonin 4-5 hours before regular bedtime to allow release of Melatonin to prepare body for rest Also encouraged to look into CBTi for insomnia, printed out information for sleepio for her review  2. GAD (generalized anxiety disorder) Continue with meditation, discussed deep breathing exercises Discussed medication options--she is not wanting to be on medicine, will continue with lifestyle habits to help cope with anxiety Also discussed importance of counseling and therapy for a safe place to talk about her stress and anxiety triggers and further coping mechanisms   General Counseling: Nichole Pitts understanding of the findings of todays visit and agrees with plan of treatment. I have discussed any further diagnostic evaluation that may be needed or ordered today. We also reviewed her medications today. she has been encouraged to call the office with any questions or concerns that should arise related to todays visit.  Time spent: 30 Minutes Time spent includes review of chart, medications, test results and follow-up plan with the patient.  This patient was seen by Leeanne Deed AGNP-C in Collaboration with Dr Lyndon Code as a part of collaborative care agreement     Lubertha Basque. Otie Headlee AGNP-C Internal medicine

## 2020-04-30 ENCOUNTER — Encounter: Payer: Self-pay | Admitting: Hospice and Palliative Medicine

## 2020-06-23 ENCOUNTER — Encounter: Payer: BLUE CROSS/BLUE SHIELD | Admitting: Hospice and Palliative Medicine

## 2020-09-08 ENCOUNTER — Other Ambulatory Visit: Payer: Self-pay

## 2021-03-17 ENCOUNTER — Telehealth: Payer: Self-pay

## 2021-03-17 NOTE — Telephone Encounter (Signed)
Left vm to confirm 03/18/21 appointment-Toni 

## 2021-03-18 ENCOUNTER — Other Ambulatory Visit: Payer: Self-pay

## 2021-03-18 ENCOUNTER — Ambulatory Visit (INDEPENDENT_AMBULATORY_CARE_PROVIDER_SITE_OTHER): Payer: BLUE CROSS/BLUE SHIELD | Admitting: Physician Assistant

## 2021-03-18 ENCOUNTER — Encounter: Payer: Self-pay | Admitting: Physician Assistant

## 2021-03-18 DIAGNOSIS — R5383 Other fatigue: Secondary | ICD-10-CM

## 2021-03-18 DIAGNOSIS — R300 Dysuria: Secondary | ICD-10-CM | POA: Diagnosis not present

## 2021-03-18 DIAGNOSIS — F411 Generalized anxiety disorder: Secondary | ICD-10-CM | POA: Diagnosis not present

## 2021-03-18 DIAGNOSIS — Z0001 Encounter for general adult medical examination with abnormal findings: Secondary | ICD-10-CM | POA: Diagnosis not present

## 2021-03-18 DIAGNOSIS — E282 Polycystic ovarian syndrome: Secondary | ICD-10-CM | POA: Diagnosis not present

## 2021-03-18 DIAGNOSIS — Z01419 Encounter for gynecological examination (general) (routine) without abnormal findings: Secondary | ICD-10-CM

## 2021-03-18 NOTE — Progress Notes (Signed)
[facility] [address] [place], [place] [address]  Internal MEDICINE  Office Visit Note  Patient Name: Nichole Pitts  [id number]  [id number]  Date of Service: 03/20/2021  Chief Complaint  Patient presents with   Annual Exam     HPI Pt is here for routine health maintenance examination -Sleeping well, got a new mattress and has been helping -Stays active, walking and goes to the gym -eating well usually -anxiety has not been a problem lately, if she gets anxious she is able to do some exercises to help calm her down -She had right hip pain a month ago and went to urgent care and was diagnosed with bursitis  -Takes vitamins-zinc, vitamin D -She has a hx of PCOS and was evaluated by OBGYN  a few years ago, but declined medication and has been managing well. She starts her next cycle on the 20th. Her pap was done by them in 2020. -Does mention a hx of arrhythmia, not a problem now usually. States she was told in the past that it is not concerning unless it lasts for awhile, but usually comes and goes, especially when anxious or if she is dehydrated. Denies feeling this currently. Discussed it is likely some palpitations when she is anxious or possible PVCs that can be intermittent. Will have her call for any recurrences/worsening and can consider long term monitor  Current Medication: No outpatient encounter medications on file as of 03/18/2021.   No facility-administered encounter medications on file as of 03/18/2021.    Surgical History: History reviewed. No pertinent surgical history.  Medical History: Past Medical History:  Diagnosis Date   History of PCOS    PCOS (polycystic ovarian syndrome)    Scoliosis     Family History: Family History  Problem Relation Age of Onset   Cancer Father    Diabetes Maternal Grandmother    Stroke Maternal Grandmother    Diabetes Paternal Grandmother    Hypertension Paternal Grandmother    Hypertension Mother     Hypertension Brother       Review of Systems  Constitutional:  Negative for chills, diaphoresis and fatigue.  HENT:  Negative for ear pain, postnasal drip and sinus pressure.   Eyes:  Negative for photophobia, discharge, redness, itching and visual disturbance.  Respiratory:  Negative for cough, shortness of breath and wheezing.   Cardiovascular:  Negative for chest pain, palpitations and leg swelling.  Gastrointestinal:  Negative for abdominal pain, constipation, diarrhea, nausea and vomiting.  Genitourinary:  Negative for dysuria and flank pain.  Musculoskeletal:  Negative for arthralgias, back pain, gait problem and neck pain.  Skin:  Negative for color change.  Allergic/Immunologic: Negative for environmental allergies and food allergies.  Neurological:  Negative for dizziness and headaches.  Hematological:  Does not bruise/bleed easily.  Psychiatric/Behavioral:  Negative for agitation, behavioral problems (depression) and hallucinations. The patient is nervous/anxious.     Vital Signs: BP 101/66   Pulse 63   Temp 97.8 F (36.6 C)   Resp 16   Ht 5\' 2"  (1.575 m)   Wt 186 lb (84.4 kg)   SpO2 97%   BMI 34.02 kg/m    Physical Exam Vitals and nursing note reviewed.  Constitutional:      General: She is not in acute distress.    Appearance: She is well-developed. She is obese. She is not diaphoretic.  HENT:     Head: Normocephalic and atraumatic.     Right Ear: External ear normal.  Left Ear: External ear normal.     Nose: Nose normal.     Mouth/Throat:     Pharynx: No oropharyngeal exudate.  Eyes:     General: No scleral icterus.       Right eye: No discharge.        Left eye: No discharge.     Conjunctiva/sclera: Conjunctivae normal.     Pupils: Pupils are equal, round, and reactive to light.  Neck:     Thyroid: No thyromegaly.     Vascular: No JVD.     Trachea: No tracheal deviation.  Cardiovascular:     Rate and Rhythm: Normal rate and regular rhythm.      Heart sounds: Normal heart sounds. No murmur heard.   No friction rub. No gallop.  Pulmonary:     Effort: Pulmonary effort is normal. No respiratory distress.     Breath sounds: Normal breath sounds. No stridor. No wheezing or rales.  Chest:     Chest wall: No tenderness.  Breasts:    Right: Normal. No mass.     Left: Normal. No mass.  Abdominal:     General: Bowel sounds are normal. There is no distension.     Palpations: Abdomen is soft. There is no mass.     Tenderness: There is no abdominal tenderness. There is no guarding or rebound.  Musculoskeletal:        General: No tenderness or deformity. Normal range of motion.     Cervical back: Normal range of motion and neck supple.  Lymphadenopathy:     Cervical: No cervical adenopathy.  Skin:    General: Skin is warm and dry.     Coloration: Skin is not pale.     Findings: No erythema or rash.  Neurological:     Mental Status: She is alert.     Cranial Nerves: No cranial nerve deficit.     Motor: No abnormal muscle tone.     Coordination: Coordination normal.     Deep Tendon Reflexes: Reflexes are normal and symmetric.  Psychiatric:        Behavior: Behavior normal.        Thought Content: Thought content normal.        Judgment: Judgment normal.     LABS: Recent Results (from the past 2160 hour(s))  UA/M w/rflx Culture, Routine     Status: Abnormal (Preliminary result)   Collection Time: 03/18/21  3:19 PM   Specimen: Urine   Urine  Result Value Ref Range   Specific Gravity, UA 1.012 1.005 - 1.030   pH, UA 7.5 5.0 - 7.5   Color, UA Yellow Yellow   Appearance Ur Clear Clear   Leukocytes,UA 1+ (A) Negative   Protein,UA Negative Negative/Trace   Glucose, UA Negative Negative   Ketones, UA Negative Negative   RBC, UA Negative Negative   Bilirubin, UA Negative Negative   Urobilinogen, Ur 0.2 0.2 - 1.0 mg/dL   Nitrite, UA Negative Negative   Microscopic Examination See below:     Comment: Microscopic was indicated  and was performed.   Urinalysis Reflex Comment     Comment: This specimen has reflexed to a Urine Culture.  Microscopic Examination     Status: None   Collection Time: 03/18/21  3:19 PM   Urine  Result Value Ref Range   WBC, UA None seen 0 - 5 /hpf   RBC 0-2 0 - 2 /hpf   Epithelial Cells (non renal) None seen 0 - 10 /  hpf   Casts None seen None seen /lpf   Bacteria, UA None seen None seen/Few  Urine Culture, Reflex     Status: None (Preliminary result)   Collection Time: 03/18/21  3:19 PM   Urine  Result Value Ref Range   Urine Culture, Routine WILL FOLLOW      Assessment/Plan: 1. Encounter for general adult medical examination with abnormal findings CPE performed, routine labs ordered  2. GAD (generalized anxiety disorder) Stable, does well with active thinking exercises  3. PCOS (polycystic ovarian syndrome) Stable, denies any problems at this time, but will f/u with OBGYN as needed  4. Visit for gynecologic examination Breast exam performed  5. Other fatigue - CBC w/Diff/Platelet - Comprehensive metabolic panel - TSH + free T4 - Lipid Panel With LDL/HDL Ratio  6. Dysuria - UA/M w/rflx Culture, Routine   General Counseling: Nichole Pitts verbalizes understanding of the findings of todays visit and agrees with plan of treatment. I have discussed any further diagnostic evaluation that may be needed or ordered today. We also reviewed her medications today. she has been encouraged to call the office with any questions or concerns that should arise related to todays visit.    Counseling:    Orders Placed This Encounter  Procedures   Microscopic Examination   Urine Culture, Reflex   UA/M w/rflx Culture, Routine   CBC w/Diff/Platelet   Comprehensive metabolic panel   TSH + free T4   Lipid Panel With LDL/HDL Ratio    No orders of the defined types were placed in this encounter.   This patient was seen by Drema Dallas, PA-C in collaboration with Dr. Clayborn Bigness as  a part of collaborative care agreement.  Total time spent:35 Minutes  Time spent includes review of chart, medications, test results, and follow up plan with the patient.     Lavera Guise, MD  Internal Medicine

## 2021-03-23 LAB — UA/M W/RFLX CULTURE, ROUTINE
Bilirubin, UA: NEGATIVE
Glucose, UA: NEGATIVE
Ketones, UA: NEGATIVE
Nitrite, UA: NEGATIVE
Protein,UA: NEGATIVE
RBC, UA: NEGATIVE
Specific Gravity, UA: 1.012 (ref 1.005–1.030)
Urobilinogen, Ur: 0.2 mg/dL (ref 0.2–1.0)
pH, UA: 7.5 (ref 5.0–7.5)

## 2021-03-23 LAB — URINE CULTURE, REFLEX

## 2021-03-23 LAB — MICROSCOPIC EXAMINATION
Bacteria, UA: NONE SEEN
Casts: NONE SEEN /lpf
Epithelial Cells (non renal): NONE SEEN /hpf (ref 0–10)
WBC, UA: NONE SEEN /hpf (ref 0–5)

## 2021-04-02 LAB — COMPREHENSIVE METABOLIC PANEL
ALT: 18 IU/L (ref 0–32)
AST: 16 IU/L (ref 0–40)
Albumin/Globulin Ratio: 2 (ref 1.2–2.2)
Albumin: 4.2 g/dL (ref 3.8–4.8)
Alkaline Phosphatase: 87 IU/L (ref 44–121)
BUN/Creatinine Ratio: 22 (ref 9–23)
BUN: 14 mg/dL (ref 6–20)
Bilirubin Total: 0.3 mg/dL (ref 0.0–1.2)
CO2: 21 mmol/L (ref 20–29)
Calcium: 9 mg/dL (ref 8.7–10.2)
Chloride: 106 mmol/L (ref 96–106)
Creatinine, Ser: 0.63 mg/dL (ref 0.57–1.00)
Globulin, Total: 2.1 g/dL (ref 1.5–4.5)
Glucose: 78 mg/dL (ref 70–99)
Potassium: 4.4 mmol/L (ref 3.5–5.2)
Sodium: 140 mmol/L (ref 134–144)
Total Protein: 6.3 g/dL (ref 6.0–8.5)
eGFR: 120 mL/min/{1.73_m2} (ref >59)

## 2021-04-02 LAB — CBC WITH DIFFERENTIAL/PLATELET
Basophils Absolute: 0.1 10*3/uL (ref 0.0–0.2)
Basos: 1 %
EOS (ABSOLUTE): 0.1 10*3/uL (ref 0.0–0.4)
Eos: 1 %
Hematocrit: 38.3 % (ref 34.0–46.6)
Hemoglobin: 13.2 g/dL (ref 11.1–15.9)
Immature Grans (Abs): 0 10*3/uL (ref 0.0–0.1)
Immature Granulocytes: 0 %
Lymphocytes Absolute: 1.5 10*3/uL (ref 0.7–3.1)
Lymphs: 13 %
MCH: 33.4 pg — ABNORMAL HIGH (ref 26.6–33.0)
MCHC: 34.5 g/dL (ref 31.5–35.7)
MCV: 97 fL (ref 79–97)
Monocytes Absolute: 0.8 10*3/uL (ref 0.1–0.9)
Monocytes: 7 %
Neutrophils Absolute: 8.6 10*3/uL — ABNORMAL HIGH (ref 1.4–7.0)
Neutrophils: 78 %
Platelets: 285 10*3/uL (ref 150–450)
RBC: 3.95 x10E6/uL (ref 3.77–5.28)
RDW: 10.7 % — ABNORMAL LOW (ref 11.7–15.4)
WBC: 11 10*3/uL — ABNORMAL HIGH (ref 3.4–10.8)

## 2021-04-02 LAB — LIPID PANEL WITH LDL/HDL RATIO
Cholesterol, Total: 171 mg/dL (ref 100–199)
HDL: 56 mg/dL (ref >39)
LDL Chol Calc (NIH): 104 mg/dL — ABNORMAL HIGH (ref 0–99)
LDL/HDL Ratio: 1.9 ratio (ref 0.0–3.2)
Triglycerides: 57 mg/dL (ref 0–149)
VLDL Cholesterol Cal: 11 mg/dL (ref 5–40)

## 2021-04-02 LAB — TSH+FREE T4
Free T4: 0.97 ng/dL (ref 0.82–1.77)
TSH: 1.14 u[IU]/mL (ref 0.450–4.500)

## 2021-04-04 ENCOUNTER — Telehealth: Payer: Self-pay

## 2021-04-04 NOTE — Progress Notes (Signed)
LMOM, pt needs to know labs overall looked ok, but WBC was slightly elevated and Alyssa wants to know if pt was feeling sick when she had labs done.

## 2021-04-04 NOTE — Telephone Encounter (Signed)
LMOM that we will continue to monitor

## 2021-04-04 NOTE — Telephone Encounter (Signed)
-----   Message from Carlean Jews, PA-C sent at 04/04/2021  8:37 AM EST ----- Please let pt know her labs overall looked ok, but her WBC were slightly elevated. Please ask if she was feeling sick at all when she had her labs done.

## 2021-04-07 ENCOUNTER — Other Ambulatory Visit: Payer: Self-pay | Admitting: Physician Assistant

## 2021-04-07 DIAGNOSIS — D72829 Elevated white blood cell count, unspecified: Secondary | ICD-10-CM

## 2021-04-07 NOTE — Progress Notes (Signed)
Spoke to pt, informed her we can repeat blood work in about 6 months. Spoke to [employer] and she will put in the order.

## 2021-05-23 DIAGNOSIS — J209 Acute bronchitis, unspecified: Secondary | ICD-10-CM | POA: Diagnosis not present

## 2021-05-23 DIAGNOSIS — R051 Acute cough: Secondary | ICD-10-CM | POA: Diagnosis not present

## 2021-05-23 DIAGNOSIS — R0602 Shortness of breath: Secondary | ICD-10-CM | POA: Diagnosis not present

## 2021-06-22 DIAGNOSIS — J0100 Acute maxillary sinusitis, unspecified: Secondary | ICD-10-CM | POA: Diagnosis not present

## 2022-02-08 NOTE — Telephone Encounter (Signed)
Pls see message

## 2022-03-09 ENCOUNTER — Encounter: Payer: 59 | Admitting: Physician Assistant

## 2022-03-10 ENCOUNTER — Encounter: Payer: 59 | Admitting: Physician Assistant

## 2022-03-21 ENCOUNTER — Telehealth: Payer: Self-pay | Admitting: Physician Assistant

## 2022-03-21 NOTE — Telephone Encounter (Signed)
Left vm and sent mychart message to confirm 03/30/22 appointment-Toni  

## 2022-03-30 ENCOUNTER — Encounter: Payer: Self-pay | Admitting: Physician Assistant

## 2022-03-30 ENCOUNTER — Ambulatory Visit (INDEPENDENT_AMBULATORY_CARE_PROVIDER_SITE_OTHER): Payer: 59 | Admitting: Physician Assistant

## 2022-03-30 VITALS — BP 120/70 | HR 85 | Temp 97.8°F | Resp 16 | Ht 61.0 in | Wt 190.4 lb

## 2022-03-30 DIAGNOSIS — E669 Obesity, unspecified: Secondary | ICD-10-CM | POA: Diagnosis not present

## 2022-03-30 DIAGNOSIS — R300 Dysuria: Secondary | ICD-10-CM | POA: Diagnosis not present

## 2022-03-30 DIAGNOSIS — E782 Mixed hyperlipidemia: Secondary | ICD-10-CM

## 2022-03-30 DIAGNOSIS — Z0001 Encounter for general adult medical examination with abnormal findings: Secondary | ICD-10-CM

## 2022-03-30 DIAGNOSIS — R7989 Other specified abnormal findings of blood chemistry: Secondary | ICD-10-CM

## 2022-03-30 DIAGNOSIS — R5383 Other fatigue: Secondary | ICD-10-CM

## 2022-03-30 NOTE — Progress Notes (Signed)
[facility] [address] [place], [place] [address]  Internal MEDICINE  Office Visit Note  Patient Name: Nichole Pitts  [id number]  [id number]  Date of Service: 03/30/2022  Chief Complaint  Patient presents with   Annual Exam     HPI Pt is here for routine health maintenance examination -Changing focus to help with managing weight recently -New job as Psychologist, occupational for people with special needs which sometimes leads to challenges getting meals in and uses meal replacement drinks when needed for protein intake -Walking 3-4 times per week -Sleeping well overall, will use melatonin as needed -Back sometimes bothersome, doing some stretching to help with this -Will order baseline labs  Current Medication: No outpatient encounter medications on file as of 03/30/2022.   No facility-administered encounter medications on file as of 03/30/2022.    Surgical History: History reviewed. No pertinent surgical history.  Medical History: Past Medical History:  Diagnosis Date   History of PCOS    PCOS (polycystic ovarian syndrome)    Scoliosis     Family History: Family History  Problem Relation Age of Onset   Cancer Father    Diabetes Maternal Grandmother    Stroke Maternal Grandmother    Diabetes Paternal Grandmother    Hypertension Paternal Grandmother    Hypertension Mother    Hypertension Brother       Review of Systems  Constitutional:  Negative for chills, fatigue and unexpected weight change.  HENT:  Negative for congestion, postnasal drip, rhinorrhea, sneezing and sore throat.   Eyes:  Negative for redness.  Respiratory:  Negative for cough, chest tightness and shortness of breath.   Cardiovascular:  Negative for chest pain and palpitations.  Gastrointestinal:  Negative for abdominal pain, constipation, diarrhea, nausea and vomiting.  Genitourinary:  Negative for dysuria and frequency.  Musculoskeletal:  Positive for back pain. Negative for arthralgias,  joint swelling and neck pain.  Skin:  Negative for rash.  Neurological: Negative.  Negative for tremors and numbness.  Hematological:  Negative for adenopathy. Does not bruise/bleed easily.  Psychiatric/Behavioral:  Negative for behavioral problems (Depression), sleep disturbance and suicidal ideas. The patient is not nervous/anxious.      Vital Signs: BP 120/70   Pulse 85   Temp 97.8 F (36.6 C)   Resp 16   Ht 5\' 1"  (1.549 m)   Wt 190 lb 6.4 oz (86.4 kg)   SpO2 98%   BMI 35.98 kg/m    Physical Exam Vitals and nursing note reviewed.  Constitutional:      General: She is not in acute distress.    Appearance: She is well-developed. She is obese. She is not diaphoretic.  HENT:     Head: Normocephalic and atraumatic.     Right Ear: External ear normal.     Left Ear: External ear normal.     Nose: Nose normal.     Mouth/Throat:     Pharynx: No oropharyngeal exudate.  Eyes:     Conjunctiva/sclera: Conjunctivae normal.     Pupils: Pupils are equal, round, and reactive to light.  Neck:     Thyroid: No thyromegaly.     Vascular: No JVD.     Trachea: No tracheal deviation.  Cardiovascular:     Rate and Rhythm: Normal rate and regular rhythm.     Heart sounds: Normal heart sounds. No murmur heard.    No friction rub. No gallop.  Pulmonary:     Effort: Pulmonary effort is normal. No respiratory distress.  Breath sounds: Normal breath sounds. No stridor. No wheezing.  Chest:  Breasts:    Right: Normal. No mass.     Left: Normal. No mass.  Abdominal:     General: Bowel sounds are normal.     Palpations: Abdomen is soft.     Tenderness: There is no abdominal tenderness.  Musculoskeletal:        General: No tenderness or deformity. Normal range of motion.     Cervical back: Normal range of motion and neck supple.  Lymphadenopathy:     Cervical: No cervical adenopathy.  Skin:    General: Skin is warm and dry.     Coloration: Skin is not pale.     Findings: No erythema  or rash.  Neurological:     Mental Status: She is alert.     Cranial Nerves: No cranial nerve deficit.     Motor: No abnormal muscle tone.     Coordination: Coordination normal.     Deep Tendon Reflexes: Reflexes are normal and symmetric.  Psychiatric:        Behavior: Behavior normal.        Thought Content: Thought content normal.        Judgment: Judgment normal.      LABS: No results found for this or any previous visit (from the past 2160 hour(s)).      Assessment/Plan: 1. Encounter for general adult medical examination with abnormal findings Cpe performed, routine fasting labs ordered - CBC w/Diff/Platelet - Comprehensive metabolic panel - TSH + free T4 - Lipid Panel With LDL/HDL Ratio  2. Mixed hyperlipidemia - Lipid Panel With LDL/HDL Ratio  3. Abnormal thyroid blood test - TSH + free T4  4. Obesity (BMI 30-39.9) Working on diet and exercise to help with managing weight  5. Other fatigue - CBC w/Diff/Platelet - Comprehensive metabolic panel - TSH + free T4 - Lipid Panel With LDL/HDL Ratio  6. Dysuria - UA/M w/rflx Culture, Routine   General Counseling: Megan verbalizes understanding of the findings of todays visit and agrees with plan of treatment. I have discussed any further diagnostic evaluation that may be needed or ordered today. We also reviewed her medications today. she has been encouraged to call the office with any questions or concerns that should arise related to todays visit.    Counseling:    Orders Placed This Encounter  Procedures   UA/M w/rflx Culture, Routine   CBC w/Diff/Platelet   Comprehensive metabolic panel   TSH + free T4   Lipid Panel With LDL/HDL Ratio    No orders of the defined types were placed in this encounter.   This patient was seen by Lynn Ito, PA-C in collaboration with Dr. Beverely Risen as a part of collaborative care agreement.  Total time spent:35 Minutes  Time spent includes review of chart,  medications, test results, and follow up plan with the patient.     Lyndon Code, MD  Internal Medicine

## 2022-03-31 LAB — UA/M W/RFLX CULTURE, ROUTINE
Bilirubin, UA: NEGATIVE
Glucose, UA: NEGATIVE
Ketones, UA: NEGATIVE
Leukocytes,UA: NEGATIVE
Nitrite, UA: NEGATIVE
Protein,UA: NEGATIVE
RBC, UA: NEGATIVE
Specific Gravity, UA: 1.013 (ref 1.005–1.030)
Urobilinogen, Ur: 0.2 mg/dL (ref 0.2–1.0)
pH, UA: 6 (ref 5.0–7.5)

## 2022-03-31 LAB — MICROSCOPIC EXAMINATION
Bacteria, UA: NONE SEEN
Casts: NONE SEEN /lpf
Epithelial Cells (non renal): NONE SEEN /hpf (ref 0–10)
RBC, Urine: NONE SEEN /hpf (ref 0–2)

## 2022-04-05 ENCOUNTER — Encounter: Payer: Self-pay | Admitting: Physician Assistant

## 2022-04-06 DIAGNOSIS — Z0001 Encounter for general adult medical examination with abnormal findings: Secondary | ICD-10-CM | POA: Diagnosis not present

## 2022-04-06 DIAGNOSIS — E782 Mixed hyperlipidemia: Secondary | ICD-10-CM | POA: Diagnosis not present

## 2022-04-06 DIAGNOSIS — R7989 Other specified abnormal findings of blood chemistry: Secondary | ICD-10-CM | POA: Diagnosis not present

## 2022-04-06 DIAGNOSIS — R5383 Other fatigue: Secondary | ICD-10-CM | POA: Diagnosis not present

## 2022-04-07 ENCOUNTER — Telehealth: Payer: Self-pay

## 2022-04-07 LAB — CBC WITH DIFFERENTIAL/PLATELET
Basophils Absolute: 0.1 10*3/uL (ref 0.0–0.2)
Basos: 1 %
EOS (ABSOLUTE): 0.1 10*3/uL (ref 0.0–0.4)
Eos: 1 %
Hematocrit: 39.2 % (ref 34.0–46.6)
Hemoglobin: 13.3 g/dL (ref 11.1–15.9)
Immature Grans (Abs): 0 10*3/uL (ref 0.0–0.1)
Immature Granulocytes: 0 %
Lymphocytes Absolute: 1.4 10*3/uL (ref 0.7–3.1)
Lymphs: 29 %
MCH: 33.6 pg — ABNORMAL HIGH (ref 26.6–33.0)
MCHC: 33.9 g/dL (ref 31.5–35.7)
MCV: 99 fL — ABNORMAL HIGH (ref 79–97)
Monocytes Absolute: 0.4 10*3/uL (ref 0.1–0.9)
Monocytes: 8 %
Neutrophils Absolute: 3 10*3/uL (ref 1.4–7.0)
Neutrophils: 61 %
Platelets: 279 10*3/uL (ref 150–450)
RBC: 3.96 x10E6/uL (ref 3.77–5.28)
RDW: 11.4 % — ABNORMAL LOW (ref 11.7–15.4)
WBC: 5 10*3/uL (ref 3.4–10.8)

## 2022-04-07 LAB — COMPREHENSIVE METABOLIC PANEL
ALT: 19 IU/L (ref 0–32)
AST: 22 IU/L (ref 0–40)
Albumin/Globulin Ratio: 1.9 (ref 1.2–2.2)
Albumin: 4.3 g/dL (ref 3.9–4.9)
Alkaline Phosphatase: 89 IU/L (ref 44–121)
BUN/Creatinine Ratio: 14 (ref 9–23)
BUN: 10 mg/dL (ref 6–20)
Bilirubin Total: 0.4 mg/dL (ref 0.0–1.2)
CO2: 23 mmol/L (ref 20–29)
Calcium: 9.3 mg/dL (ref 8.7–10.2)
Chloride: 107 mmol/L — ABNORMAL HIGH (ref 96–106)
Creatinine, Ser: 0.73 mg/dL (ref 0.57–1.00)
Globulin, Total: 2.3 g/dL (ref 1.5–4.5)
Glucose: 84 mg/dL (ref 70–99)
Potassium: 4.5 mmol/L (ref 3.5–5.2)
Sodium: 144 mmol/L (ref 134–144)
Total Protein: 6.6 g/dL (ref 6.0–8.5)
eGFR: 111 mL/min/{1.73_m2} (ref >59)

## 2022-04-07 LAB — LIPID PANEL WITH LDL/HDL RATIO
Cholesterol, Total: 166 mg/dL (ref 100–199)
HDL: 56 mg/dL (ref >39)
LDL Chol Calc (NIH): 99 mg/dL (ref 0–99)
LDL/HDL Ratio: 1.8 ratio (ref 0.0–3.2)
Triglycerides: 54 mg/dL (ref 0–149)
VLDL Cholesterol Cal: 11 mg/dL (ref 5–40)

## 2022-04-07 LAB — TSH+FREE T4
Free T4: 1.07 ng/dL (ref 0.82–1.77)
TSH: 1.33 u[IU]/mL (ref 0.450–4.500)

## 2022-04-07 NOTE — Telephone Encounter (Signed)
Added B12/Folates for patient.

## 2022-04-07 NOTE — Telephone Encounter (Signed)
-----   Message from Carlean Jews, PA-C sent at 04/07/2022  1:05 PM EST ----- Please have lab add B12/folate

## 2022-04-12 ENCOUNTER — Telehealth: Payer: Self-pay

## 2022-04-12 ENCOUNTER — Encounter: Payer: Self-pay | Admitting: Physician Assistant

## 2022-04-12 NOTE — Telephone Encounter (Signed)
Left message for patient regarding normal lab results and improved cholesterol levels.

## 2022-04-12 NOTE — Telephone Encounter (Signed)
-----   Message from Carlean Jews, PA-C sent at 04/11/2022  5:13 PM EST ----- Please let her know that overall her labs look good. Cholesterol has improved.

## 2022-04-15 LAB — SPECIMEN STATUS REPORT

## 2022-04-15 LAB — B12 AND FOLATE PANEL
Folate: >20 ng/mL (ref >3.0)
Vitamin B-12: 1403 pg/mL — ABNORMAL HIGH (ref 232–1245)

## 2022-06-09 DIAGNOSIS — U071 COVID-19: Secondary | ICD-10-CM | POA: Diagnosis not present

## 2022-06-09 DIAGNOSIS — Z6832 Body mass index (BMI) 32.0-32.9, adult: Secondary | ICD-10-CM | POA: Diagnosis not present

## 2023-04-05 ENCOUNTER — Ambulatory Visit (INDEPENDENT_AMBULATORY_CARE_PROVIDER_SITE_OTHER): Payer: 59 | Admitting: Physician Assistant

## 2023-04-05 ENCOUNTER — Encounter: Payer: Self-pay | Admitting: Physician Assistant

## 2023-04-05 VITALS — BP 110/72 | HR 94 | Temp 98.0°F | Resp 16 | Ht 62.5 in | Wt 188.6 lb

## 2023-04-05 DIAGNOSIS — Z0001 Encounter for general adult medical examination with abnormal findings: Secondary | ICD-10-CM

## 2023-04-05 NOTE — Progress Notes (Signed)
[facility] [address] [place], [place] [address]  Internal MEDICINE  Office Visit Note  Patient Name: Nichole Pitts  [id number]  [id number]  Date of Service: 04/13/2023  Chief Complaint  Patient presents with   Annual Exam     HPI Pt is here for routine health maintenance examination -no concerns today -UTD on pap -BP stable -Does notice she has more gas prior to cycle, gasX works well though -working on diet -due for labs  Current Medication: No outpatient encounter medications on file as of 04/05/2023.   No facility-administered encounter medications on file as of 04/05/2023.    Surgical History: History reviewed. No pertinent surgical history.  Medical History: Past Medical History:  Diagnosis Date   History of PCOS    PCOS (polycystic ovarian syndrome)    Scoliosis     Family History: Family History  Problem Relation Age of Onset   Cancer Father    Diabetes Maternal Grandmother    Stroke Maternal Grandmother    Diabetes Paternal Grandmother    Hypertension Paternal Grandmother    Hypertension Mother    Hypertension Brother       Review of Systems  Constitutional:  Negative for chills, fatigue and unexpected weight change.  HENT:  Negative for congestion, rhinorrhea, sneezing and sore throat.   Eyes:  Negative for redness.  Respiratory:  Negative for cough, chest tightness and shortness of breath.   Cardiovascular:  Negative for chest pain and palpitations.  Gastrointestinal:  Negative for abdominal pain, constipation, diarrhea, nausea and vomiting.  Genitourinary:  Negative for dysuria and frequency.  Musculoskeletal:  Negative for arthralgias, back pain, joint swelling and neck pain.  Skin:  Negative for rash.  Neurological: Negative.  Negative for tremors and numbness.  Hematological:  Negative for adenopathy. Does not bruise/bleed easily.  Psychiatric/Behavioral:  Negative for behavioral problems (Depression), sleep disturbance  and suicidal ideas. The patient is not nervous/anxious.      Vital Signs: BP 110/72   Pulse 94   Temp 98 F (36.7 C)   Resp 16   Ht 5' 2.5" (1.588 m)   Wt 188 lb 9.6 oz (85.5 kg)   SpO2 99%   BMI 33.95 kg/m    Physical Exam Vitals and nursing note reviewed.  Constitutional:      General: She is not in acute distress.    Appearance: She is well-developed. She is obese. She is not diaphoretic.  HENT:     Head: Normocephalic and atraumatic.     Right Ear: External ear normal.     Left Ear: External ear normal.     Nose: Nose normal.     Mouth/Throat:     Pharynx: No oropharyngeal exudate.  Eyes:     Conjunctiva/sclera: Conjunctivae normal.     Pupils: Pupils are equal, round, and reactive to light.  Neck:     Thyroid: No thyromegaly.     Vascular: No JVD.     Trachea: No tracheal deviation.  Cardiovascular:     Rate and Rhythm: Normal rate and regular rhythm.     Heart sounds: Normal heart sounds. No murmur heard.    No friction rub. No gallop.  Pulmonary:     Effort: Pulmonary effort is normal. No respiratory distress.     Breath sounds: Normal breath sounds. No stridor. No wheezing.  Chest:  Breasts:    Right: Normal. No mass.     Left: Normal. No mass.  Abdominal:     General: Bowel sounds  are normal.     Palpations: Abdomen is soft.     Tenderness: There is no abdominal tenderness.  Musculoskeletal:        General: No tenderness or deformity. Normal range of motion.     Cervical back: Normal range of motion and neck supple.  Lymphadenopathy:     Cervical: No cervical adenopathy.  Skin:    General: Skin is warm and dry.     Coloration: Skin is not pale.     Findings: No erythema or rash.  Neurological:     Mental Status: She is alert.     Cranial Nerves: No cranial nerve deficit.     Motor: No abnormal muscle tone.     Coordination: Coordination normal.     Deep Tendon Reflexes: Reflexes are normal and symmetric.  Psychiatric:        Behavior:  Behavior normal.        Thought Content: Thought content normal.        Judgment: Judgment normal.      LABS: Recent Results (from the past 2160 hours)  CBC with Differential/Platelet     Status: Abnormal   Collection Time: 04/12/23  8:27 AM  Result Value Ref Range   WBC 5.6 3.4 - 10.8 x10E3/uL   RBC 4.01 3.77 - 5.28 x10E6/uL   Hemoglobin 13.5 11.1 - 15.9 g/dL   Hematocrit 16.1 09.6 - 46.6 %   MCV 100 (H) 79 - 97 fL   MCH 33.7 (H) 26.6 - 33.0 pg   MCHC 33.8 31.5 - 35.7 g/dL   RDW 04.5 (L) 40.9 - 81.1 %   Platelets 243 150 - 450 x10E3/uL   Neutrophils 60 Not Estab. %   Lymphs 30 Not Estab. %   Monocytes 8 Not Estab. %   Eos 1 Not Estab. %   Basos 1 Not Estab. %   Neutrophils Absolute 3.3 1.4 - 7.0 x10E3/uL   Lymphocytes Absolute 1.7 0.7 - 3.1 x10E3/uL   Monocytes Absolute 0.5 0.1 - 0.9 x10E3/uL   EOS (ABSOLUTE) 0.1 0.0 - 0.4 x10E3/uL   Basophils Absolute 0.1 0.0 - 0.2 x10E3/uL   Immature Granulocytes 0 Not Estab. %   Immature Grans (Abs) 0.0 0.0 - 0.1 x10E3/uL  Comprehensive metabolic panel     Status: None   Collection Time: 04/12/23  8:27 AM  Result Value Ref Range   Glucose 78 70 - 99 mg/dL   BUN 11 6 - 20 mg/dL   Creatinine, Ser 9.14 0.57 - 1.00 mg/dL   eGFR 782 >95 AO/ZHY/8.65   BUN/Creatinine Ratio 18 9 - 23   Sodium 139 134 - 144 mmol/L   Potassium 4.2 3.5 - 5.2 mmol/L   Chloride 106 96 - 106 mmol/L   CO2 21 20 - 29 mmol/L   Calcium 8.9 8.7 - 10.2 mg/dL   Total Protein 6.5 6.0 - 8.5 g/dL   Albumin 4.1 3.9 - 4.9 g/dL   Globulin, Total 2.4 1.5 - 4.5 g/dL   Bilirubin Total 0.4 0.0 - 1.2 mg/dL   Alkaline Phosphatase 91 44 - 121 IU/L   AST 17 0 - 40 IU/L   ALT 14 0 - 32 IU/L  UA/M w/rflx Culture, Comp     Status: Abnormal   Collection Time: 04/12/23  8:27 AM  Result Value Ref Range   Specific Gravity, UA 1.015 1.005 - 1.030   pH, UA 8.0 (H) 5.0 - 7.5   Color, UA Yellow Yellow   Appearance Ur Clear Clear  Leukocytes,UA Negative Negative   Protein,UA  Negative Negative/Trace   Glucose, UA Negative Negative   Ketones, UA Negative Negative   RBC, UA Negative Negative   Bilirubin, UA Negative Negative   Urobilinogen, Ur 0.2 0.2 - 1.0 mg/dL   Nitrite, UA Negative Negative   Microscopic Examination Comment     Comment: Microscopic follows if indicated.   Microscopic Examination See below:     Comment: Microscopic was indicated and was performed.   Urinalysis Reflex Comment     Comment: This specimen will not reflex to a Urine Culture.  Microscopic Examination     Status: None   Collection Time: 04/12/23  8:27 AM  Result Value Ref Range   WBC, UA None seen 0 - 5 /hpf   RBC, Urine None seen 0 - 2 /hpf   Epithelial Cells (non renal) None seen 0 - 10 /hpf   Casts None seen None seen /lpf   Bacteria, UA None seen None seen/Few  Lipid Panel With LDL/HDL Ratio     Status: None   Collection Time: 04/12/23  8:27 AM  Result Value Ref Range   Cholesterol, Total 167 100 - 199 mg/dL   Triglycerides 75 0 - 149 mg/dL   HDL 55 >40 mg/dL   VLDL Cholesterol Cal 14 5 - 40 mg/dL   LDL Chol Calc (NIH) 98 0 - 99 mg/dL   LDL/HDL Ratio 1.8 0.0 - 3.2 ratio    Comment:                                     LDL/HDL Ratio                                             Men  Women                               1/2 Avg.Risk  1.0    1.5                                   Avg.Risk  3.6    3.2                                2X Avg.Risk  6.2    5.0                                3X Avg.Risk  8.0    6.1   B12 and Folate Panel     Status: None   Collection Time: 04/12/23  8:27 AM  Result Value Ref Range   Vitamin B-12 1,011 232 - 1,245 pg/mL   Folate 18.9 >3.0 ng/mL    Comment: A serum folate concentration of less than 3.1 ng/mL is considered to represent clinical deficiency.   Hgb A1c w/o eAG     Status: None   Collection Time: 04/12/23  8:27 AM  Result Value Ref Range   Hgb A1c MFr Bld 4.9 4.8 - 5.6 %    Comment:  Prediabetes: 5.7 - 6.4           Diabetes: >6.4          Glycemic control for adults with diabetes: <7.0   T4, free     Status: None   Collection Time: 04/12/23  8:27 AM  Result Value Ref Range   Free T4 1.11 0.82 - 1.77 ng/dL  TSH     Status: None   Collection Time: 04/12/23  8:27 AM  Result Value Ref Range   TSH 1.700 0.450 - 4.500 uIU/mL  VITAMIN D 25 Hydroxy (Vit-D Deficiency, Fractures)     Status: None   Collection Time: 04/12/23  8:27 AM  Result Value Ref Range   Vit D, 25-Hydroxy 49.8 30.0 - 100.0 ng/mL    Comment: Vitamin D deficiency has been defined by the Institute of Medicine and an Endocrine Society practice guideline as a level of serum 25-OH vitamin D less than 20 ng/mL (1,2). The Endocrine Society went on to further define vitamin D insufficiency as a level between 21 and 29 ng/mL (2). 1. IOM (Institute of Medicine). 2010. Dietary reference    intakes for calcium and D. [place]: The    [employer]. 2. Holick MF, Binkley [place], Bischoff-Ferrari HA, et al.    Evaluation, treatment, and prevention of vitamin D    deficiency: an Endocrine Society clinical practice    guideline. JCEM. 2011 Jul; 96(7):1911-30.         Assessment/Plan: 1. Encounter for general adult medical examination with abnormal findings (Primary) Cpe performed, UTD on PHM, lab slip given   General Counseling: Giselle verbalizes understanding of the findings of todays visit and agrees with plan of treatment. I have discussed any further diagnostic evaluation that may be needed or ordered today. We also reviewed her medications today. she has been encouraged to call the office with any questions or concerns that should arise related to todays visit.    Counseling:    No orders of the defined types were placed in this encounter.   No orders of the defined types were placed in this encounter.   This patient was seen by Lynn Ito, PA-C in collaboration with Dr. Beverely Risen as a part of collaborative  care agreement.  Total time spent:35 Minutes  Time spent includes review of chart, medications, test results, and follow up plan with the patient.     Lyndon Code, MD  Internal Medicine

## 2023-04-12 ENCOUNTER — Other Ambulatory Visit: Payer: Self-pay | Admitting: Physician Assistant

## 2023-04-12 DIAGNOSIS — Z0001 Encounter for general adult medical examination with abnormal findings: Secondary | ICD-10-CM | POA: Diagnosis not present

## 2023-04-12 DIAGNOSIS — R5383 Other fatigue: Secondary | ICD-10-CM | POA: Diagnosis not present

## 2023-04-12 DIAGNOSIS — R6889 Other general symptoms and signs: Secondary | ICD-10-CM | POA: Diagnosis not present

## 2023-04-12 DIAGNOSIS — E538 Deficiency of other specified B group vitamins: Secondary | ICD-10-CM | POA: Diagnosis not present

## 2023-04-12 DIAGNOSIS — R300 Dysuria: Secondary | ICD-10-CM | POA: Diagnosis not present

## 2023-04-12 DIAGNOSIS — Z131 Encounter for screening for diabetes mellitus: Secondary | ICD-10-CM | POA: Diagnosis not present

## 2023-04-12 DIAGNOSIS — Z13220 Encounter for screening for lipoid disorders: Secondary | ICD-10-CM | POA: Diagnosis not present

## 2023-04-13 LAB — COMPREHENSIVE METABOLIC PANEL
ALT: 14 [IU]/L (ref 0–32)
AST: 17 [IU]/L (ref 0–40)
Albumin: 4.1 g/dL (ref 3.9–4.9)
Alkaline Phosphatase: 91 [IU]/L (ref 44–121)
BUN/Creatinine Ratio: 18 (ref 9–23)
BUN: 11 mg/dL (ref 6–20)
Bilirubin Total: 0.4 mg/dL (ref 0.0–1.2)
CO2: 21 mmol/L (ref 20–29)
Calcium: 8.9 mg/dL (ref 8.7–10.2)
Chloride: 106 mmol/L (ref 96–106)
Creatinine, Ser: 0.62 mg/dL (ref 0.57–1.00)
Globulin, Total: 2.4 g/dL (ref 1.5–4.5)
Glucose: 78 mg/dL (ref 70–99)
Potassium: 4.2 mmol/L (ref 3.5–5.2)
Sodium: 139 mmol/L (ref 134–144)
Total Protein: 6.5 g/dL (ref 6.0–8.5)
eGFR: 119 mL/min/{1.73_m2} (ref >59)

## 2023-04-13 LAB — T4, FREE: Free T4: 1.11 ng/dL (ref 0.82–1.77)

## 2023-04-13 LAB — CBC WITH DIFFERENTIAL/PLATELET
Basophils Absolute: 0.1 10*3/uL (ref 0.0–0.2)
Basos: 1 %
EOS (ABSOLUTE): 0.1 10*3/uL (ref 0.0–0.4)
Eos: 1 %
Hematocrit: 39.9 % (ref 34.0–46.6)
Hemoglobin: 13.5 g/dL (ref 11.1–15.9)
Immature Grans (Abs): 0 10*3/uL (ref 0.0–0.1)
Immature Granulocytes: 0 %
Lymphocytes Absolute: 1.7 10*3/uL (ref 0.7–3.1)
Lymphs: 30 %
MCH: 33.7 pg — ABNORMAL HIGH (ref 26.6–33.0)
MCHC: 33.8 g/dL (ref 31.5–35.7)
MCV: 100 fL — ABNORMAL HIGH (ref 79–97)
Monocytes Absolute: 0.5 10*3/uL (ref 0.1–0.9)
Monocytes: 8 %
Neutrophils Absolute: 3.3 10*3/uL (ref 1.4–7.0)
Neutrophils: 60 %
Platelets: 243 10*3/uL (ref 150–450)
RBC: 4.01 x10E6/uL (ref 3.77–5.28)
RDW: 10.9 % — ABNORMAL LOW (ref 11.7–15.4)
WBC: 5.6 10*3/uL (ref 3.4–10.8)

## 2023-04-13 LAB — UA/M W/RFLX CULTURE, COMP
Bilirubin, UA: NEGATIVE
Glucose, UA: NEGATIVE
Ketones, UA: NEGATIVE
Leukocytes,UA: NEGATIVE
Nitrite, UA: NEGATIVE
Protein,UA: NEGATIVE
RBC, UA: NEGATIVE
Specific Gravity, UA: 1.015 (ref 1.005–1.030)
Urobilinogen, Ur: 0.2 mg/dL (ref 0.2–1.0)
pH, UA: 8 — ABNORMAL HIGH (ref 5.0–7.5)

## 2023-04-13 LAB — MICROSCOPIC EXAMINATION
Bacteria, UA: NONE SEEN
Casts: NONE SEEN /[LPF]
Epithelial Cells (non renal): NONE SEEN /[HPF] (ref 0–10)
RBC, Urine: NONE SEEN /[HPF] (ref 0–2)
WBC, UA: NONE SEEN /[HPF] (ref 0–5)

## 2023-04-13 LAB — LIPID PANEL WITH LDL/HDL RATIO
Cholesterol, Total: 167 mg/dL (ref 100–199)
HDL: 55 mg/dL (ref >39)
LDL Chol Calc (NIH): 98 mg/dL (ref 0–99)
LDL/HDL Ratio: 1.8 {ratio} (ref 0.0–3.2)
Triglycerides: 75 mg/dL (ref 0–149)
VLDL Cholesterol Cal: 14 mg/dL (ref 5–40)

## 2023-04-13 LAB — TSH: TSH: 1.7 u[IU]/mL (ref 0.450–4.500)

## 2023-04-13 LAB — B12 AND FOLATE PANEL
Folate: 18.9 ng/mL (ref >3.0)
Vitamin B-12: 1011 pg/mL (ref 232–1245)

## 2023-04-13 LAB — VITAMIN D 25 HYDROXY (VIT D DEFICIENCY, FRACTURES): Vit D, 25-Hydroxy: 49.8 ng/mL (ref 30.0–100.0)

## 2023-04-13 LAB — HGB A1C W/O EAG: Hgb A1c MFr Bld: 4.9 % (ref 4.8–5.6)

## 2023-04-20 ENCOUNTER — Encounter: Payer: Self-pay | Admitting: Physician Assistant

## 2023-04-24 LAB — IRON AND TIBC
Iron Saturation: 41 % (ref 15–55)
Iron: 127 ug/dL (ref 27–159)
Total Iron Binding Capacity: 313 ug/dL (ref 250–450)
UIBC: 186 ug/dL (ref 131–425)

## 2023-04-24 LAB — SPECIMEN STATUS REPORT

## 2023-04-24 LAB — FERRITIN: Ferritin: 77 ng/mL (ref 15–150)

## 2024-04-07 ENCOUNTER — Encounter: Payer: 59 | Admitting: Physician Assistant

## 2024-04-07 ENCOUNTER — Ambulatory Visit: Payer: 59 | Admitting: Physician Assistant

## 2024-04-07 ENCOUNTER — Encounter: Payer: Self-pay | Admitting: Physician Assistant

## 2024-04-07 VITALS — BP 112/68 | HR 103 | Temp 98.0°F | Resp 16 | Ht 62.0 in | Wt 197.0 lb

## 2024-04-07 DIAGNOSIS — R300 Dysuria: Secondary | ICD-10-CM

## 2024-04-07 DIAGNOSIS — R5383 Other fatigue: Secondary | ICD-10-CM

## 2024-04-07 DIAGNOSIS — Z1329 Encounter for screening for other suspected endocrine disorder: Secondary | ICD-10-CM

## 2024-04-07 DIAGNOSIS — Z13220 Encounter for screening for lipoid disorders: Secondary | ICD-10-CM

## 2024-04-07 DIAGNOSIS — Z0001 Encounter for general adult medical examination with abnormal findings: Secondary | ICD-10-CM

## 2024-04-07 DIAGNOSIS — Z124 Encounter for screening for malignant neoplasm of cervix: Secondary | ICD-10-CM

## 2024-04-07 NOTE — Progress Notes (Unsigned)
 [facility] [address] [place], [place] [address]  Internal MEDICINE  Office Visit Note  Patient Name: Nichole Pitts  [id number]  [id number]  Date of Service: 04/07/2024  Chief Complaint  Patient presents with   Annual Exam     HPI Pt is here for routine health maintenance examination -sleep doing well -exercise hit or miss, trying to incorporate this into routine -appetite has been good -may be getting married in next year -due for pap today, due for labs  Current Medication: No outpatient encounter medications on file as of 04/07/2024.   No facility-administered encounter medications on file as of 04/07/2024.    Surgical History: History reviewed. No pertinent surgical history.  Medical History: Past Medical History:  Diagnosis Date   History of PCOS    PCOS (polycystic ovarian syndrome)    Scoliosis     Family History: Family History  Problem Relation Age of Onset   Cancer Father    Diabetes Maternal Grandmother    Stroke Maternal Grandmother    Diabetes Paternal Grandmother    Hypertension Paternal Grandmother    Hypertension Mother    Hypertension Brother       Review of Systems  Constitutional:  Negative for chills, fatigue and unexpected weight change.  HENT:  Negative for congestion, rhinorrhea, sneezing and sore throat.   Eyes:  Negative for redness.  Respiratory:  Negative for cough, chest tightness and shortness of breath.   Cardiovascular:  Negative for chest pain and palpitations.  Gastrointestinal:  Negative for abdominal pain, constipation, diarrhea, nausea and vomiting.  Genitourinary:  Negative for dysuria and frequency.  Musculoskeletal:  Negative for arthralgias, back pain, joint swelling and neck pain.  Skin:  Negative for rash.  Neurological: Negative.  Negative for tremors and numbness.  Hematological:  Negative for adenopathy. Does not bruise/bleed easily.  Psychiatric/Behavioral:  Negative for behavioral problems  (Depression), sleep disturbance and suicidal ideas. The patient is not nervous/anxious.      Vital Signs: BP 112/68   Pulse (!) 103   Temp 98 F (36.7 C)   Resp 16   Ht 5' 2 (1.575 m)   Wt 197 lb (89.4 kg)   SpO2 96%   BMI 36.03 kg/m    Physical Exam Vitals and nursing note reviewed. Exam conducted with a chaperone present.  Constitutional:      General: She is not in acute distress.    Appearance: She is well-developed. She is obese. She is not diaphoretic.  HENT:     Head: Normocephalic and atraumatic.     Right Ear: External ear normal.     Left Ear: External ear normal.     Nose: Nose normal.     Mouth/Throat:     Pharynx: No oropharyngeal exudate.  Eyes:     Conjunctiva/sclera: Conjunctivae normal.     Pupils: Pupils are equal, round, and reactive to light.  Neck:     Thyroid : No thyromegaly.     Vascular: No JVD.     Trachea: No tracheal deviation.  Cardiovascular:     Rate and Rhythm: Normal rate and regular rhythm.     Heart sounds: Normal heart sounds. No murmur heard.    No friction rub. No gallop.  Pulmonary:     Effort: Pulmonary effort is normal. No respiratory distress.     Breath sounds: Normal breath sounds. No stridor. No wheezing.  Chest:     Chest wall: No tenderness.  Breasts:    Right: Normal. No mass.  Left: Normal. No mass.  Abdominal:     General: Bowel sounds are normal.     Palpations: Abdomen is soft.     Tenderness: There is no abdominal tenderness.  Genitourinary:    Exam position: Lithotomy position.     Cervix: Friability and eversion present.     Comments: Pap performed Musculoskeletal:        General: No tenderness or deformity. Normal range of motion.     Cervical back: Normal range of motion and neck supple.  Lymphadenopathy:     Cervical: No cervical adenopathy.  Skin:    General: Skin is warm and dry.     Coloration: Skin is not pale.     Findings: No erythema or rash.  Neurological:     Mental Status: She is  alert.     Motor: No abnormal muscle tone.     Coordination: Coordination normal.     Deep Tendon Reflexes: Reflexes are normal and symmetric.  Psychiatric:        Behavior: Behavior normal.        Thought Content: Thought content normal.        Judgment: Judgment normal.      LABS: Recent Results (from the past 2160 hours)  UA/M w/rflx Culture, Routine     Status: Abnormal (Preliminary result)   Collection Time: 04/07/24  2:58 PM   Specimen: Urine   Urine  Result Value Ref Range   Specific Gravity, UA 1.014 1.005 - 1.030   pH, UA 6.5 5.0 - 7.5   Color, UA Yellow Yellow   Appearance Ur Clear Clear   Leukocytes,UA Trace (A) Negative   Protein,UA Negative Negative/Trace   Glucose, UA Negative Negative   Ketones, UA Trace (A) Negative   RBC, UA Trace (A) Negative   Bilirubin, UA Negative Negative   Urobilinogen, Ur 0.2 0.2 - 1.0 mg/dL   Nitrite, UA Negative Negative   Microscopic Examination See below:     Comment: Microscopic was indicated and was performed.   Urinalysis Reflex Comment     Comment: This specimen has reflexed to a Urine Culture.  Microscopic Examination     Status: Abnormal   Collection Time: 04/07/24  2:58 PM   Urine  Result Value Ref Range   WBC, UA 0-5 0 - 5 /hpf   RBC, Urine 3-10 (A) 0 - 2 /hpf   Epithelial Cells (non renal) 0-10 0 - 10 /hpf   Casts None seen None seen /lpf   Bacteria, UA None seen None seen/Few  Urine Culture, Reflex     Status: None (Preliminary result)   Collection Time: 04/07/24  2:58 PM   Urine  Result Value Ref Range   Urine Culture, Routine WILL FOLLOW         Assessment/Plan: 1. Encounter for general adult medical examination with abnormal findings (Primary) CPE performed, labs ordered, pap performed  2. Routine cervical smear - IGP, Aptima HPV  3. Lipid screening - Lipid Panel With LDL/HDL Ratio  4. Thyroid  disorder screen - TSH + free T4  5. Other fatigue - CBC w/Diff/Platelet - Comprehensive metabolic  panel with GFR - TSH + free T4 - Lipid Panel With LDL/HDL Ratio  6. Dysuria - UA/M w/rflx Culture, Routine   General Counseling: Vashti verbalizes understanding of the findings of todays visit and agrees with plan of treatment. I have discussed any further diagnostic evaluation that may be needed or ordered today. We also reviewed her medications today. she has been encouraged  to call the office with any questions or concerns that should arise related to todays visit.    Counseling:    Orders Placed This Encounter  Procedures   Microscopic Examination   Urine Culture, Reflex   UA/M w/rflx Culture, Routine   CBC w/Diff/Platelet   Comprehensive metabolic panel with GFR   TSH + free T4   Lipid Panel With LDL/HDL Ratio    No orders of the defined types were placed in this encounter.   This patient was seen by Tinnie Pro, PA-C in collaboration with Dr. Sigrid Bathe as a part of collaborative care agreement.  Total time spent:35 Minutes  Time spent includes review of chart, medications, test results, and follow up plan with the patient.     Sigrid CHRISTELLA Bathe, MD  Internal Medicine

## 2024-04-09 LAB — UA/M W/RFLX CULTURE, ROUTINE
Bilirubin, UA: NEGATIVE
Glucose, UA: NEGATIVE
Nitrite, UA: NEGATIVE
Protein,UA: NEGATIVE
Specific Gravity, UA: 1.014 (ref 1.005–1.030)
Urobilinogen, Ur: 0.2 mg/dL (ref 0.2–1.0)
pH, UA: 6.5 (ref 5.0–7.5)

## 2024-04-09 LAB — URINE CULTURE, REFLEX

## 2024-04-09 LAB — MICROSCOPIC EXAMINATION
Bacteria, UA: NONE SEEN
Casts: NONE SEEN /LPF

## 2024-04-09 LAB — IGP, APTIMA HPV: HPV Aptima: NEGATIVE

## 2024-04-10 ENCOUNTER — Ambulatory Visit: Payer: Self-pay | Admitting: Physician Assistant

## 2024-04-11 LAB — CBC WITH DIFFERENTIAL/PLATELET
Basophils Absolute: 0.1 x10E3/uL (ref 0.0–0.2)
Basos: 1 %
EOS (ABSOLUTE): 0.1 x10E3/uL (ref 0.0–0.4)
Eos: 2 %
Hematocrit: 40.9 % (ref 34.0–46.6)
Hemoglobin: 13.5 g/dL (ref 11.1–15.9)
Immature Grans (Abs): 0 x10E3/uL (ref 0.0–0.1)
Immature Granulocytes: 0 %
Lymphocytes Absolute: 1.3 x10E3/uL (ref 0.7–3.1)
Lymphs: 27 %
MCH: 33.3 pg — ABNORMAL HIGH (ref 26.6–33.0)
MCHC: 33 g/dL (ref 31.5–35.7)
MCV: 101 fL — ABNORMAL HIGH (ref 79–97)
Monocytes Absolute: 0.4 x10E3/uL (ref 0.1–0.9)
Monocytes: 7 %
Neutrophils Absolute: 3 x10E3/uL (ref 1.4–7.0)
Neutrophils: 63 %
Platelets: 306 x10E3/uL (ref 150–450)
RBC: 4.06 x10E6/uL (ref 3.77–5.28)
RDW: 11 % — ABNORMAL LOW (ref 11.7–15.4)
WBC: 4.8 x10E3/uL (ref 3.4–10.8)

## 2024-04-11 LAB — COMPREHENSIVE METABOLIC PANEL WITH GFR
ALT: 22 IU/L (ref 0–32)
AST: 20 IU/L (ref 0–40)
Albumin: 4.5 g/dL (ref 3.9–4.9)
Alkaline Phosphatase: 93 IU/L (ref 41–116)
BUN/Creatinine Ratio: 16 (ref 9–23)
BUN: 10 mg/dL (ref 6–20)
Bilirubin Total: 0.3 mg/dL (ref 0.0–1.2)
CO2: 21 mmol/L (ref 20–29)
Calcium: 9.5 mg/dL (ref 8.7–10.2)
Chloride: 105 mmol/L (ref 96–106)
Creatinine, Ser: 0.62 mg/dL (ref 0.57–1.00)
Globulin, Total: 2.3 g/dL (ref 1.5–4.5)
Glucose: 83 mg/dL (ref 70–99)
Potassium: 4.4 mmol/L (ref 3.5–5.2)
Sodium: 141 mmol/L (ref 134–144)
Total Protein: 6.8 g/dL (ref 6.0–8.5)
eGFR: 118 mL/min/1.73 (ref >59)

## 2024-04-11 LAB — LIPID PANEL WITH LDL/HDL RATIO
Cholesterol, Total: 164 mg/dL (ref 100–199)
HDL: 55 mg/dL (ref >39)
LDL Chol Calc (NIH): 96 mg/dL (ref 0–99)
LDL/HDL Ratio: 1.7 ratio (ref 0.0–3.2)
Triglycerides: 65 mg/dL (ref 0–149)
VLDL Cholesterol Cal: 13 mg/dL (ref 5–40)

## 2024-04-11 LAB — TSH+FREE T4
Free T4: 1.12 ng/dL (ref 0.82–1.77)
TSH: 1.3 u[IU]/mL (ref 0.450–4.500)

## 2024-04-14 NOTE — Telephone Encounter (Signed)
-----   Message from Nichole Pitts Pro sent at 04/10/2024  4:20 PM EST ----- That should be fine and order it the same way so we have a microscopic exam to look at the blood again. thanks ----- Message ----- From: Almer Bi, CMA Sent: 04/10/2024   3:25 PM EST To: Nichole Pitts Pro, PA-C  She did her urine prior to doing her pap. Do I bring her in for a nurse visit to repeat urine? ----- Message ----- From: Pro Nichole MARLA, PA-C Sent: 04/10/2024   2:12 PM EST To: Bi Almer, CMA  Please let her know that her pap was normal and HPV negative. Her urine did show some blood, but no growth on culture to show significant growth for a UTI. Did she recently finish cycle or provide  sample post pap? Sometimes this can contaminate urine findings. If not we may want to recheck urine.

## 2024-04-14 NOTE — Telephone Encounter (Addendum)
Spoke with patient regarding labs.

## 2024-04-30 ENCOUNTER — Encounter: Payer: Self-pay | Admitting: Physician Assistant

## 2024-05-21 ENCOUNTER — Other Ambulatory Visit: Payer: Self-pay | Admitting: Physician Assistant

## 2024-05-21 DIAGNOSIS — R718 Other abnormality of red blood cells: Secondary | ICD-10-CM

## 2024-05-21 DIAGNOSIS — R7989 Other specified abnormal findings of blood chemistry: Secondary | ICD-10-CM

## 2024-06-09 ENCOUNTER — Inpatient Hospital Stay: Payer: PRIVATE HEALTH INSURANCE | Admitting: Oncology

## 2024-06-09 ENCOUNTER — Inpatient Hospital Stay: Payer: PRIVATE HEALTH INSURANCE

## 2025-04-08 ENCOUNTER — Encounter: Admitting: Physician Assistant
